# Patient Record
Sex: Female | Born: 1995
Health system: Southern US, Community
[De-identification: ages and names within clinical notes are randomized; demographics above are authoritative.]

## PROBLEM LIST (undated history)

## (undated) DIAGNOSIS — F419 Anxiety disorder, unspecified: Secondary | ICD-10-CM

## (undated) DIAGNOSIS — G43719 Chronic migraine without aura, intractable, without status migrainosus: Secondary | ICD-10-CM

## (undated) DIAGNOSIS — B9620 Unspecified Escherichia coli [E. coli] as the cause of diseases classified elsewhere: Secondary | ICD-10-CM

## (undated) DIAGNOSIS — N12 Tubulo-interstitial nephritis, not specified as acute or chronic: Secondary | ICD-10-CM

## (undated) DIAGNOSIS — F909 Attention-deficit hyperactivity disorder, unspecified type: Secondary | ICD-10-CM

## (undated) DIAGNOSIS — F32A Depression, unspecified: Secondary | ICD-10-CM

## (undated) DIAGNOSIS — F329 Major depressive disorder, single episode, unspecified: Secondary | ICD-10-CM

## (undated) DIAGNOSIS — F99 Mental disorder, not otherwise specified: Secondary | ICD-10-CM

## (undated) DIAGNOSIS — R7881 Bacteremia: Secondary | ICD-10-CM

## (undated) DIAGNOSIS — Z87891 Personal history of nicotine dependence: Secondary | ICD-10-CM

## (undated) HISTORY — DX: Depression, unspecified: F32.A

## (undated) HISTORY — PX: OTHER SURGICAL HISTORY: SHX169

## (undated) HISTORY — DX: Major depressive disorder, single episode, unspecified: F32.9

## (undated) HISTORY — DX: Unspecified Escherichia coli (E. coli) as the cause of diseases classified elsewhere: B96.20

## (undated) HISTORY — DX: Bacteremia: R78.81

## (undated) HISTORY — DX: Mental disorder, not otherwise specified: F99

## (undated) HISTORY — DX: Attention-deficit hyperactivity disorder, unspecified type: F90.9

## (undated) HISTORY — DX: Chronic migraine without aura, intractable, without status migrainosus: G43.719

## (undated) HISTORY — DX: Anxiety disorder, unspecified: F41.9

## (undated) HISTORY — DX: Personal history of nicotine dependence: Z87.891

---
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
## 2015-05-02 DIAGNOSIS — F902 Attention-deficit hyperactivity disorder, combined type: Secondary | ICD-10-CM | POA: Diagnosis not present

## 2015-05-02 DIAGNOSIS — N943 Premenstrual tension syndrome: Secondary | ICD-10-CM | POA: Diagnosis not present

## 2015-05-02 DIAGNOSIS — F329 Major depressive disorder, single episode, unspecified: Secondary | ICD-10-CM | POA: Diagnosis not present

## 2015-06-03 DIAGNOSIS — F900 Attention-deficit hyperactivity disorder, predominantly inattentive type: Secondary | ICD-10-CM | POA: Diagnosis not present

## 2015-06-03 DIAGNOSIS — F332 Major depressive disorder, recurrent severe without psychotic features: Secondary | ICD-10-CM | POA: Diagnosis not present

## 2015-06-03 DIAGNOSIS — F331 Major depressive disorder, recurrent, moderate: Secondary | ICD-10-CM | POA: Diagnosis not present

## 2015-06-12 DIAGNOSIS — S60222A Contusion of left hand, initial encounter: Secondary | ICD-10-CM | POA: Diagnosis not present

## 2015-07-08 DIAGNOSIS — F900 Attention-deficit hyperactivity disorder, predominantly inattentive type: Secondary | ICD-10-CM | POA: Diagnosis not present

## 2015-07-08 DIAGNOSIS — F332 Major depressive disorder, recurrent severe without psychotic features: Secondary | ICD-10-CM | POA: Diagnosis not present

## 2015-08-08 DIAGNOSIS — B355 Tinea imbricata: Secondary | ICD-10-CM | POA: Diagnosis not present

## 2015-08-12 DIAGNOSIS — F332 Major depressive disorder, recurrent severe without psychotic features: Secondary | ICD-10-CM | POA: Diagnosis not present

## 2015-08-12 DIAGNOSIS — F900 Attention-deficit hyperactivity disorder, predominantly inattentive type: Secondary | ICD-10-CM | POA: Diagnosis not present

## 2015-09-11 DIAGNOSIS — F332 Major depressive disorder, recurrent severe without psychotic features: Secondary | ICD-10-CM | POA: Diagnosis not present

## 2015-09-11 DIAGNOSIS — F900 Attention-deficit hyperactivity disorder, predominantly inattentive type: Secondary | ICD-10-CM | POA: Diagnosis not present

## 2015-12-05 DIAGNOSIS — F332 Major depressive disorder, recurrent severe without psychotic features: Secondary | ICD-10-CM | POA: Diagnosis not present

## 2015-12-05 DIAGNOSIS — F900 Attention-deficit hyperactivity disorder, predominantly inattentive type: Secondary | ICD-10-CM | POA: Diagnosis not present

## 2015-12-13 DIAGNOSIS — J0100 Acute maxillary sinusitis, unspecified: Secondary | ICD-10-CM | POA: Diagnosis not present

## 2016-01-01 DIAGNOSIS — F900 Attention-deficit hyperactivity disorder, predominantly inattentive type: Secondary | ICD-10-CM | POA: Diagnosis not present

## 2016-01-01 DIAGNOSIS — F332 Major depressive disorder, recurrent severe without psychotic features: Secondary | ICD-10-CM | POA: Diagnosis not present

## 2016-01-28 DIAGNOSIS — F332 Major depressive disorder, recurrent severe without psychotic features: Secondary | ICD-10-CM | POA: Diagnosis not present

## 2016-01-28 DIAGNOSIS — F900 Attention-deficit hyperactivity disorder, predominantly inattentive type: Secondary | ICD-10-CM | POA: Diagnosis not present

## 2016-03-06 DIAGNOSIS — J039 Acute tonsillitis, unspecified: Secondary | ICD-10-CM | POA: Diagnosis not present

## 2016-03-13 DIAGNOSIS — F900 Attention-deficit hyperactivity disorder, predominantly inattentive type: Secondary | ICD-10-CM | POA: Diagnosis not present

## 2016-03-13 DIAGNOSIS — Z79899 Other long term (current) drug therapy: Secondary | ICD-10-CM | POA: Diagnosis not present

## 2016-03-13 DIAGNOSIS — F332 Major depressive disorder, recurrent severe without psychotic features: Secondary | ICD-10-CM | POA: Diagnosis not present

## 2016-04-09 DIAGNOSIS — L255 Unspecified contact dermatitis due to plants, except food: Secondary | ICD-10-CM | POA: Diagnosis not present

## 2016-04-27 DIAGNOSIS — M79672 Pain in left foot: Secondary | ICD-10-CM | POA: Diagnosis not present

## 2016-05-26 DIAGNOSIS — S93402A Sprain of unspecified ligament of left ankle, initial encounter: Secondary | ICD-10-CM | POA: Diagnosis not present

## 2016-06-05 DIAGNOSIS — F332 Major depressive disorder, recurrent severe without psychotic features: Secondary | ICD-10-CM | POA: Diagnosis not present

## 2016-06-05 DIAGNOSIS — F900 Attention-deficit hyperactivity disorder, predominantly inattentive type: Secondary | ICD-10-CM | POA: Diagnosis not present

## 2016-09-03 DIAGNOSIS — R51 Headache: Secondary | ICD-10-CM | POA: Diagnosis not present

## 2016-09-03 DIAGNOSIS — R635 Abnormal weight gain: Secondary | ICD-10-CM | POA: Diagnosis not present

## 2016-09-03 DIAGNOSIS — M722 Plantar fascial fibromatosis: Secondary | ICD-10-CM | POA: Diagnosis not present

## 2016-09-03 DIAGNOSIS — Z79899 Other long term (current) drug therapy: Secondary | ICD-10-CM | POA: Diagnosis not present

## 2016-09-30 DIAGNOSIS — M722 Plantar fascial fibromatosis: Secondary | ICD-10-CM | POA: Diagnosis not present

## 2016-09-30 DIAGNOSIS — R000 Tachycardia, unspecified: Secondary | ICD-10-CM | POA: Diagnosis not present

## 2016-09-30 DIAGNOSIS — R220 Localized swelling, mass and lump, head: Secondary | ICD-10-CM | POA: Diagnosis not present

## 2016-10-06 ENCOUNTER — Encounter: Payer: Self-pay | Admitting: Podiatry

## 2016-10-06 ENCOUNTER — Ambulatory Visit (INDEPENDENT_AMBULATORY_CARE_PROVIDER_SITE_OTHER): Payer: BLUE CROSS/BLUE SHIELD | Admitting: Podiatry

## 2016-10-06 ENCOUNTER — Ambulatory Visit (INDEPENDENT_AMBULATORY_CARE_PROVIDER_SITE_OTHER): Payer: Self-pay

## 2016-10-06 VITALS — BP 112/77 | HR 82 | Ht 64.0 in | Wt 202.0 lb

## 2016-10-06 DIAGNOSIS — M722 Plantar fascial fibromatosis: Secondary | ICD-10-CM

## 2016-10-06 MED ORDER — MELOXICAM 15 MG PO TABS: 15.0000 mg | ORAL_TABLET | Freq: Every day | ORAL | 0 refills | Status: DC

## 2016-10-06 NOTE — Patient Instructions (Signed)

## 2016-10-09 DIAGNOSIS — F332 Major depressive disorder, recurrent severe without psychotic features: Secondary | ICD-10-CM | POA: Diagnosis not present

## 2016-10-09 DIAGNOSIS — F900 Attention-deficit hyperactivity disorder, predominantly inattentive type: Secondary | ICD-10-CM | POA: Diagnosis not present

## 2016-10-14 DIAGNOSIS — M722 Plantar fascial fibromatosis: Secondary | ICD-10-CM | POA: Diagnosis not present

## 2016-10-14 MED ORDER — DEXAMETHASONE SODIUM PHOSPHATE 120 MG/30ML IJ SOLN
4.0000 mg | Freq: Once | INTRAMUSCULAR | Status: AC
  Administered 2016-10-14: 4 mg via INTRAMUSCULAR

## 2016-10-14 MED ORDER — TRIAMCINOLONE ACETONIDE 10 MG/ML IJ SUSP
5.0000 mg | Freq: Once | INTRAMUSCULAR | Status: AC
  Administered 2016-10-14: 5 mg

## 2016-10-14 NOTE — Progress Notes (Signed)
  Subjective:  Patient ID: Cathy Roman, female    DOB: 25-Oct-1995,  MRN: [id number] HPI Chief Complaint  Patient presents with  . Foot Pain    Bilateral heel pain with sharp shooting pains that go up into ankle and leg     21 y.o. female presents with the above complaint. Reports 8 month history of bilat heel pain, L>R. Reports shooting pain from her heel up into her legs. Pain worst in AM and after periods of inactivity. Denies other pedal issues.  No past medical history on file. No past surgical history on file.  Current Outpatient Prescriptions:  .  buPROPion (WELLBUTRIN XL) 150 MG 24 hr tablet, Take 1 tablet (150 mg total) by mouth daily., Disp: 30 tablet, Rfl: 0 .  FLUoxetine (PROZAC) 10 MG capsule, , Disp: , Rfl:  .  FLUoxetine (PROZAC) 20 MG capsule, , Disp: , Rfl:  .  methylphenidate (CONCERTA) 54 MG PO CR tablet, , Disp: , Rfl:  .  methylphenidate (RITALIN) 20 MG tablet, , Disp: , Rfl:  .  Norgestimate-Ethinyl Estradiol Triphasic (TRINESSA, 28,) 0.18/0.215/0.25 MG-35 MCG tablet, Take 1 tablet by mouth daily., Disp: , Rfl:  .  escitalopram (LEXAPRO) 20 MG tablet, Take 1 tablet (20 mg total) by mouth 2 (two) times daily. (Patient not taking: Reported on 10/06/2016), Disp: 60 tablet, Rfl: 0 .  meloxicam (MOBIC) 15 MG tablet, Take 1 tablet (15 mg total) by mouth daily., Disp: 30 tablet, Rfl: 0 .  Norethindrone-Ethinyl Estradiol-Fe Biphas (LO LOESTRIN FE) 1 MG-10 MCG / 10 MCG tablet, Take 1 tablet by mouth daily. (Patient not taking: Reported on 10/06/2016), Disp: 1 Package, Rfl: 11 .  traZODone (DESYREL) 50 MG tablet, Take 0.5 tablets (25 mg total) by mouth at bedtime as needed for sleep (insomnia). (Patient not taking: Reported on 10/06/2016), Disp: 30 tablet, Rfl: 0  Allergies  Allergen Reactions  . Sulfa Antibiotics Hives   Review of Systems Negative except as noted in the HPI.  Objective:   Vitals:   10/06/16 1403  BP: 112/77  Pulse: 82   General AA&O x3. Normal mood  and affect.  Vascular Dorsalis pedis and posterior tibial pulses  present 2+ bilaterally  Capillary refill normal to all digits. Pedal hair growth normal.  Neurologic Epicritic sensation grossly present bilat.  Dermatologic No open lesions. Interspaces clear of maceration. Nails well groomed and normal in appearance.  Orthopedic: MMT 5/5 in dorsiflexion, plantarflexion, inversion, and eversion left. Tender to palpation at the calcaneal tuber left. No pain with calcaneal squeeze left. Ankle ROM diminished range of motion left. Silfverskiold Test: positive left.   Radiographs: Taken and reviewed. No acute fractures. No evidence of calcaneal stress fracture.  Assessment & Plan:  Patient was evaluated and treated and all questions answered.  Plantar Fasciitis, left - XR reviewed as above.  - Educated on icing and stretching. Instructions given.  - Injection delivered as below. - eRx Meloxicam for pain and inflammation. - Heel pads dispensed.  Procedure: Injection Tendon/Ligament Location: Left plantar fascia at the glabrous junction; medial approach. Skin Prep: Alcohol. Injectate: 1 cc 0.5% marcaine plain, 1 cc dexamethasone phosphate, 0.5 cc kenalog 10. Disposition: Patient tolerated procedure well. Injection site dressed with a band-aid.  Return in about 6 weeks (around 11/17/2016).

## 2016-10-28 DIAGNOSIS — N921 Excessive and frequent menstruation with irregular cycle: Secondary | ICD-10-CM | POA: Diagnosis not present

## 2016-10-28 DIAGNOSIS — D649 Anemia, unspecified: Secondary | ICD-10-CM | POA: Diagnosis not present

## 2016-10-28 DIAGNOSIS — R51 Headache: Secondary | ICD-10-CM | POA: Diagnosis not present

## 2016-11-05 DIAGNOSIS — R51 Headache: Secondary | ICD-10-CM | POA: Diagnosis not present

## 2016-11-16 DIAGNOSIS — R51 Headache: Secondary | ICD-10-CM | POA: Diagnosis not present

## 2016-11-16 DIAGNOSIS — R000 Tachycardia, unspecified: Secondary | ICD-10-CM | POA: Diagnosis not present

## 2016-11-17 ENCOUNTER — Ambulatory Visit (INDEPENDENT_AMBULATORY_CARE_PROVIDER_SITE_OTHER): Payer: BLUE CROSS/BLUE SHIELD | Admitting: Podiatry

## 2016-11-17 DIAGNOSIS — M722 Plantar fascial fibromatosis: Secondary | ICD-10-CM | POA: Diagnosis not present

## 2016-11-17 MED ORDER — METHYLPREDNISOLONE 4 MG PO TBPK: ORAL_TABLET | ORAL | 0 refills | Status: DC

## 2016-11-17 NOTE — Patient Instructions (Signed)

## 2016-11-17 NOTE — Progress Notes (Addendum)
  Subjective:  Patient ID: Cathy Roman, female    DOB: 05/09/1995,  MRN: [id number]  Chief Complaint  Patient presents with  . Plantar Fasciitis    injections given at last visit only helped x1 wk. Bil feet are hurting again, pain is almost unbearable   21 y.o. female returns for the above complaint. States the injections helped for about a week. Yesterday were almost unbearable. But is slightly better today  Objective:  There were no vitals filed for this visit. General AA&O x3. Normal mood and affect.  Vascular Pedal pulses palpable.  Neurologic Epicritic sensation grossly intact.  Dermatologic No open lesions. Skin normal texture and turgor.  Orthopedic: Pain on palpation to the calcaneal tuber's bilaterally    Assessment & Plan:  Patient was evaluated and treated and all questions answered.  Plantar Fasciitis -Injection Declined -E Rx for Medrol pack -Discussed over-the-counter versus custom orthotics. -Pre-fab orthotics dispensed. -Will schedule patient for appointment for orthotics for plantar fasciitis.  Return in about 3 weeks (around 12/08/2016) for Plantar fasciitis.

## 2016-11-25 DIAGNOSIS — R51 Headache: Secondary | ICD-10-CM | POA: Diagnosis not present

## 2016-12-08 ENCOUNTER — Ambulatory Visit: Payer: BLUE CROSS/BLUE SHIELD | Admitting: Podiatry

## 2017-01-28 DIAGNOSIS — R51 Headache: Secondary | ICD-10-CM | POA: Diagnosis not present

## 2017-02-03 DIAGNOSIS — Z049 Encounter for examination and observation for unspecified reason: Secondary | ICD-10-CM | POA: Diagnosis not present

## 2017-02-03 DIAGNOSIS — G43719 Chronic migraine without aura, intractable, without status migrainosus: Secondary | ICD-10-CM | POA: Diagnosis not present

## 2017-02-17 DIAGNOSIS — M542 Cervicalgia: Secondary | ICD-10-CM | POA: Diagnosis not present

## 2017-02-17 DIAGNOSIS — M791 Myalgia, unspecified site: Secondary | ICD-10-CM | POA: Diagnosis not present

## 2017-02-17 DIAGNOSIS — G518 Other disorders of facial nerve: Secondary | ICD-10-CM | POA: Diagnosis not present

## 2017-02-17 DIAGNOSIS — G43719 Chronic migraine without aura, intractable, without status migrainosus: Secondary | ICD-10-CM | POA: Diagnosis not present

## 2017-03-09 NOTE — Progress Notes (Signed)
Cardiology Office Note   Date:  03/17/2017   ID:  Cathy Roman, DOB [birth year], MRN [id number]  PCP:  Philemon Kingdom, MD  Cardiologist:   Charlton Haws, MD   No chief complaint on file.     History of Present Illness: Cathy Roman is a 22 y.o. female who presents for consultation regarding tachycardia Referred by Dr Stana Bunting Significant issues with headaches. Triggers include poor sleep, weather and emotional stress Currently on host neurally active drugs including baclofen, concerta, prozac welbutrin and ritalin  She is sedentary and does not exercise. Works At [employer]. She has a 22 yo husky named Industrial/product designer. Mom works for home health in [place] Dad Has MVP. She has not had chest pain, syncope rate palpitations and functional dyspnea with Exertion Denies ETOH, drugs, excess caffeine and TSH was normal in October     Past Medical History:  Diagnosis Date  . ADHD    ADD  . Anxiety disorder   . Chronic migraine without aura, intractable, without status migrainosus   . Depression   . Former cigarette smoker   . Psychiatric disorder     Past Surgical History:  Procedure Laterality Date  . NONE       Current Outpatient Medications  Medication Sig Dispense Refill  . buPROPion (WELLBUTRIN XL) 150 MG 24 hr tablet Take 1 tablet (150 mg total) by mouth daily. 30 tablet 0  . FLUoxetine (PROZAC) 10 MG capsule     . FLUoxetine (PROZAC) 20 MG capsule     . methylphenidate (CONCERTA) 54 MG PO CR tablet     . methylphenidate (RITALIN) 20 MG tablet     . METOPROLOL TARTRATE PO Take 50 mg by mouth 2 (two) times daily.    . Norgestimate-Ethinyl Estradiol Triphasic (TRINESSA, 28,) 0.18/0.215/0.25 MG-35 MCG tablet Take 1 tablet by mouth daily.    . traZODone (DESYREL) 50 MG tablet Take 0.5 tablets (25 mg total) by mouth at bedtime as needed for sleep (insomnia). 30 tablet 0   No current facility-administered medications for this visit.     Allergies:   Sulfa antibiotics     Social History:  The patient  reports that she has quit smoking. she has never used smokeless tobacco. She reports that she does not drink alcohol or use drugs.   Family History:  The patient's family history is not on file.    ROS:  Please see the history of present illness.   Otherwise, review of systems are positive for none.   All other systems are reviewed and negative.    PHYSICAL EXAM: VS:  BP 130/78   Pulse 88   Ht 5\' 4"  (1.626 m)   Wt 213 lb 8 oz (96.8 kg)   SpO2 99%   BMI 36.65 kg/m  , BMI Body mass index is 36.65 kg/m. Affect appropriate Overweight white female  HEENT: normal Neck supple with no adenopathy JVP normal no bruits no thyromegaly Lungs clear with no wheezing and good diaphragmatic motion Heart:  S1/S2 no murmur, no rub, gallop or click PMI normal Abdomen: benighn, BS positve, no tenderness, no AAA no bruit.  No HSM or HJR Distal pulses intact with no bruits No edema Neuro non-focal Skin warm and dry No muscular weakness    EKG:  2016 NSR rate 70 normal ECG no pre excitation 02/10/17 SR rate 75 normal    Recent Labs: No results found for requested labs within last 8760 hours.    Lipid Panel No results  found for: CHOL, TRIG, HDL, CHOLHDL, VLDL, LDLCALC, LDLDIRECT    Wt Readings from Last 3 Encounters:  03/17/17 213 lb 8 oz (96.8 kg)  10/06/16 202 lb (91.6 kg)      Other studies Reviewed: Additional studies/ records that were reviewed today include: Office notes Dr Filbert SchilderPachnow.    ASSESSMENT AND PLAN:  1.  Tachycardia likely related to polypharmacy and ritalin. Continue beta blocker labs ok ECG and exam normal will order echo to r/o structural heart disease 2. ADD:  Continue Concerta as non stimulant limit use of ritalin  3. Depression: combination of Wellbutrin and Prozac in setting of additional meds for ADD Not ideal f/u primary to simplify psycho-active drugs    Current medicines are reviewed at length with the patient today.   The patient does not have concerns regarding medicines.  The following changes have been made:  no change  Labs/ tests ordered today include: Echo/TTE  No orders of the defined types were placed in this encounter.    Disposition:   FU with cardiology PRN      Signed, Charlton HawsPeter Aryaman Haliburton, MD  03/17/2017 8:48 AM    [facility] [address], [place], [place]  [address] Phone: [phone]; Fax: [phone]

## 2017-03-10 ENCOUNTER — Encounter: Payer: Self-pay | Admitting: Cardiovascular Disease

## 2017-03-16 DIAGNOSIS — Z79899 Other long term (current) drug therapy: Secondary | ICD-10-CM | POA: Diagnosis not present

## 2017-03-16 DIAGNOSIS — F900 Attention-deficit hyperactivity disorder, predominantly inattentive type: Secondary | ICD-10-CM | POA: Diagnosis not present

## 2017-03-16 DIAGNOSIS — F332 Major depressive disorder, recurrent severe without psychotic features: Secondary | ICD-10-CM | POA: Diagnosis not present

## 2017-03-17 ENCOUNTER — Encounter (INDEPENDENT_AMBULATORY_CARE_PROVIDER_SITE_OTHER): Payer: Self-pay

## 2017-03-17 ENCOUNTER — Encounter: Payer: Self-pay | Admitting: Cardiovascular Disease

## 2017-03-17 ENCOUNTER — Ambulatory Visit (INDEPENDENT_AMBULATORY_CARE_PROVIDER_SITE_OTHER): Payer: BLUE CROSS/BLUE SHIELD | Admitting: Cardiovascular Disease

## 2017-03-17 VITALS — BP 130/78 | HR 88 | Ht 64.0 in | Wt 213.5 lb

## 2017-03-17 DIAGNOSIS — R000 Tachycardia, unspecified: Secondary | ICD-10-CM

## 2017-03-17 NOTE — Addendum Note (Signed)
Addended by: Weber CooksOOPER, Jari Dipasquale on: 03/17/2017 04:52 PM   Modules accepted: Orders

## 2017-03-17 NOTE — Patient Instructions (Addendum)

## 2017-03-31 ENCOUNTER — Ambulatory Visit (HOSPITAL_COMMUNITY): Payer: BLUE CROSS/BLUE SHIELD | Attending: Cardiovascular Disease

## 2017-03-31 DIAGNOSIS — R0989 Other specified symptoms and signs involving the circulatory and respiratory systems: Secondary | ICD-10-CM

## 2017-04-13 DIAGNOSIS — F900 Attention-deficit hyperactivity disorder, predominantly inattentive type: Secondary | ICD-10-CM | POA: Diagnosis not present

## 2017-04-13 DIAGNOSIS — F332 Major depressive disorder, recurrent severe without psychotic features: Secondary | ICD-10-CM | POA: Diagnosis not present

## 2017-04-15 ENCOUNTER — Ambulatory Visit (HOSPITAL_COMMUNITY): Payer: BLUE CROSS/BLUE SHIELD | Attending: Cardiology

## 2017-04-15 ENCOUNTER — Other Ambulatory Visit: Payer: Self-pay

## 2017-04-15 DIAGNOSIS — Z87891 Personal history of nicotine dependence: Secondary | ICD-10-CM | POA: Diagnosis not present

## 2017-04-15 DIAGNOSIS — R000 Tachycardia, unspecified: Secondary | ICD-10-CM | POA: Insufficient documentation

## 2017-04-15 DIAGNOSIS — Z8249 Family history of ischemic heart disease and other diseases of the circulatory system: Secondary | ICD-10-CM | POA: Insufficient documentation

## 2017-04-15 DIAGNOSIS — G43909 Migraine, unspecified, not intractable, without status migrainosus: Secondary | ICD-10-CM | POA: Insufficient documentation

## 2017-06-08 DIAGNOSIS — F900 Attention-deficit hyperactivity disorder, predominantly inattentive type: Secondary | ICD-10-CM | POA: Diagnosis not present

## 2017-06-08 DIAGNOSIS — F332 Major depressive disorder, recurrent severe without psychotic features: Secondary | ICD-10-CM | POA: Diagnosis not present

## 2017-08-02 DIAGNOSIS — F900 Attention-deficit hyperactivity disorder, predominantly inattentive type: Secondary | ICD-10-CM | POA: Diagnosis not present

## 2017-08-02 DIAGNOSIS — F332 Major depressive disorder, recurrent severe without psychotic features: Secondary | ICD-10-CM | POA: Diagnosis not present

## 2017-09-15 DIAGNOSIS — R000 Tachycardia, unspecified: Secondary | ICD-10-CM | POA: Diagnosis not present

## 2017-09-15 DIAGNOSIS — R51 Headache: Secondary | ICD-10-CM | POA: Diagnosis not present

## 2017-09-15 DIAGNOSIS — E6609 Other obesity due to excess calories: Secondary | ICD-10-CM | POA: Diagnosis not present

## 2017-09-15 DIAGNOSIS — Z79899 Other long term (current) drug therapy: Secondary | ICD-10-CM | POA: Diagnosis not present

## 2017-09-23 DIAGNOSIS — R0789 Other chest pain: Secondary | ICD-10-CM | POA: Diagnosis not present

## 2017-09-23 DIAGNOSIS — R0782 Intercostal pain: Secondary | ICD-10-CM | POA: Diagnosis not present

## 2017-09-28 DIAGNOSIS — F900 Attention-deficit hyperactivity disorder, predominantly inattentive type: Secondary | ICD-10-CM | POA: Diagnosis not present

## 2017-09-28 DIAGNOSIS — F332 Major depressive disorder, recurrent severe without psychotic features: Secondary | ICD-10-CM | POA: Diagnosis not present

## 2017-10-12 DIAGNOSIS — J0100 Acute maxillary sinusitis, unspecified: Secondary | ICD-10-CM | POA: Diagnosis not present

## 2017-11-23 DIAGNOSIS — F332 Major depressive disorder, recurrent severe without psychotic features: Secondary | ICD-10-CM | POA: Diagnosis not present

## 2017-11-23 DIAGNOSIS — F900 Attention-deficit hyperactivity disorder, predominantly inattentive type: Secondary | ICD-10-CM | POA: Diagnosis not present

## 2018-05-18 DIAGNOSIS — F900 Attention-deficit hyperactivity disorder, predominantly inattentive type: Secondary | ICD-10-CM | POA: Diagnosis not present

## 2018-05-18 DIAGNOSIS — F331 Major depressive disorder, recurrent, moderate: Secondary | ICD-10-CM | POA: Diagnosis not present

## 2018-05-19 DIAGNOSIS — Z6841 Body Mass Index (BMI) 40.0 and over, adult: Secondary | ICD-10-CM | POA: Diagnosis not present

## 2018-05-19 DIAGNOSIS — R51 Headache: Secondary | ICD-10-CM | POA: Diagnosis not present

## 2018-08-02 DIAGNOSIS — F331 Major depressive disorder, recurrent, moderate: Secondary | ICD-10-CM | POA: Diagnosis not present

## 2018-08-02 DIAGNOSIS — F900 Attention-deficit hyperactivity disorder, predominantly inattentive type: Secondary | ICD-10-CM | POA: Diagnosis not present

## 2018-10-25 DIAGNOSIS — F331 Major depressive disorder, recurrent, moderate: Secondary | ICD-10-CM | POA: Diagnosis not present

## 2018-10-25 DIAGNOSIS — F900 Attention-deficit hyperactivity disorder, predominantly inattentive type: Secondary | ICD-10-CM | POA: Diagnosis not present

## 2018-12-11 DIAGNOSIS — S59902A Unspecified injury of left elbow, initial encounter: Secondary | ICD-10-CM | POA: Diagnosis not present

## 2018-12-11 DIAGNOSIS — S5002XA Contusion of left elbow, initial encounter: Secondary | ICD-10-CM | POA: Diagnosis not present

## 2018-12-19 DIAGNOSIS — S5002XA Contusion of left elbow, initial encounter: Secondary | ICD-10-CM | POA: Diagnosis not present

## 2018-12-26 DIAGNOSIS — R300 Dysuria: Secondary | ICD-10-CM | POA: Diagnosis not present

## 2018-12-26 DIAGNOSIS — Z20828 Contact with and (suspected) exposure to other viral communicable diseases: Secondary | ICD-10-CM | POA: Diagnosis not present

## 2018-12-26 DIAGNOSIS — N3091 Cystitis, unspecified with hematuria: Secondary | ICD-10-CM | POA: Diagnosis not present

## 2018-12-28 DIAGNOSIS — Z20828 Contact with and (suspected) exposure to other viral communicable diseases: Secondary | ICD-10-CM | POA: Diagnosis not present

## 2018-12-28 DIAGNOSIS — R05 Cough: Secondary | ICD-10-CM | POA: Diagnosis not present

## 2018-12-28 DIAGNOSIS — R509 Fever, unspecified: Secondary | ICD-10-CM | POA: Diagnosis not present

## 2018-12-28 DIAGNOSIS — R6883 Chills (without fever): Secondary | ICD-10-CM | POA: Diagnosis not present

## 2019-01-02 DIAGNOSIS — S5002XA Contusion of left elbow, initial encounter: Secondary | ICD-10-CM | POA: Diagnosis not present

## 2019-01-05 DIAGNOSIS — S5002XA Contusion of left elbow, initial encounter: Secondary | ICD-10-CM | POA: Diagnosis not present

## 2019-01-05 DIAGNOSIS — M25522 Pain in left elbow: Secondary | ICD-10-CM | POA: Diagnosis not present

## 2019-01-05 DIAGNOSIS — S59902A Unspecified injury of left elbow, initial encounter: Secondary | ICD-10-CM | POA: Diagnosis not present

## 2019-01-09 DIAGNOSIS — S5002XA Contusion of left elbow, initial encounter: Secondary | ICD-10-CM | POA: Diagnosis not present

## 2019-01-17 DIAGNOSIS — F331 Major depressive disorder, recurrent, moderate: Secondary | ICD-10-CM | POA: Diagnosis not present

## 2019-01-17 DIAGNOSIS — F900 Attention-deficit hyperactivity disorder, predominantly inattentive type: Secondary | ICD-10-CM | POA: Diagnosis not present

## 2019-01-22 DIAGNOSIS — J029 Acute pharyngitis, unspecified: Secondary | ICD-10-CM | POA: Diagnosis not present

## 2019-02-17 DIAGNOSIS — S93401A Sprain of unspecified ligament of right ankle, initial encounter: Secondary | ICD-10-CM | POA: Diagnosis not present

## 2019-03-01 DIAGNOSIS — S93401A Sprain of unspecified ligament of right ankle, initial encounter: Secondary | ICD-10-CM | POA: Diagnosis not present

## 2019-03-14 DIAGNOSIS — F331 Major depressive disorder, recurrent, moderate: Secondary | ICD-10-CM | POA: Diagnosis not present

## 2019-03-14 DIAGNOSIS — Z79899 Other long term (current) drug therapy: Secondary | ICD-10-CM | POA: Diagnosis not present

## 2019-03-14 DIAGNOSIS — F900 Attention-deficit hyperactivity disorder, predominantly inattentive type: Secondary | ICD-10-CM | POA: Diagnosis not present

## 2019-03-14 DIAGNOSIS — F909 Attention-deficit hyperactivity disorder, unspecified type: Secondary | ICD-10-CM | POA: Diagnosis not present

## 2019-04-11 DIAGNOSIS — Z79899 Other long term (current) drug therapy: Secondary | ICD-10-CM | POA: Diagnosis not present

## 2019-04-11 DIAGNOSIS — F909 Attention-deficit hyperactivity disorder, unspecified type: Secondary | ICD-10-CM | POA: Diagnosis not present

## 2019-04-11 DIAGNOSIS — F332 Major depressive disorder, recurrent severe without psychotic features: Secondary | ICD-10-CM | POA: Diagnosis not present

## 2019-04-11 DIAGNOSIS — F900 Attention-deficit hyperactivity disorder, predominantly inattentive type: Secondary | ICD-10-CM | POA: Diagnosis not present

## 2019-04-11 DIAGNOSIS — F331 Major depressive disorder, recurrent, moderate: Secondary | ICD-10-CM | POA: Diagnosis not present

## 2019-04-12 DIAGNOSIS — S93401A Sprain of unspecified ligament of right ankle, initial encounter: Secondary | ICD-10-CM | POA: Diagnosis not present

## 2019-05-16 DIAGNOSIS — F411 Generalized anxiety disorder: Secondary | ICD-10-CM | POA: Diagnosis not present

## 2019-05-16 DIAGNOSIS — F339 Major depressive disorder, recurrent, unspecified: Secondary | ICD-10-CM | POA: Diagnosis not present

## 2019-05-16 DIAGNOSIS — F4310 Post-traumatic stress disorder, unspecified: Secondary | ICD-10-CM | POA: Diagnosis not present

## 2019-05-29 DIAGNOSIS — F411 Generalized anxiety disorder: Secondary | ICD-10-CM | POA: Diagnosis not present

## 2019-05-29 DIAGNOSIS — F4310 Post-traumatic stress disorder, unspecified: Secondary | ICD-10-CM | POA: Diagnosis not present

## 2019-05-29 DIAGNOSIS — F339 Major depressive disorder, recurrent, unspecified: Secondary | ICD-10-CM | POA: Diagnosis not present

## 2019-06-12 DIAGNOSIS — F4310 Post-traumatic stress disorder, unspecified: Secondary | ICD-10-CM | POA: Diagnosis not present

## 2019-06-12 DIAGNOSIS — F339 Major depressive disorder, recurrent, unspecified: Secondary | ICD-10-CM | POA: Diagnosis not present

## 2019-06-12 DIAGNOSIS — F411 Generalized anxiety disorder: Secondary | ICD-10-CM | POA: Diagnosis not present

## 2019-06-26 DIAGNOSIS — F339 Major depressive disorder, recurrent, unspecified: Secondary | ICD-10-CM | POA: Diagnosis not present

## 2019-06-26 DIAGNOSIS — F411 Generalized anxiety disorder: Secondary | ICD-10-CM | POA: Diagnosis not present

## 2019-06-26 DIAGNOSIS — F4310 Post-traumatic stress disorder, unspecified: Secondary | ICD-10-CM | POA: Diagnosis not present

## 2019-07-04 DIAGNOSIS — F900 Attention-deficit hyperactivity disorder, predominantly inattentive type: Secondary | ICD-10-CM | POA: Diagnosis not present

## 2019-07-04 DIAGNOSIS — Z79899 Other long term (current) drug therapy: Secondary | ICD-10-CM | POA: Diagnosis not present

## 2019-07-04 DIAGNOSIS — F331 Major depressive disorder, recurrent, moderate: Secondary | ICD-10-CM | POA: Diagnosis not present

## 2019-07-04 DIAGNOSIS — F909 Attention-deficit hyperactivity disorder, unspecified type: Secondary | ICD-10-CM | POA: Diagnosis not present

## 2019-07-07 DIAGNOSIS — J069 Acute upper respiratory infection, unspecified: Secondary | ICD-10-CM | POA: Diagnosis not present

## 2019-07-07 DIAGNOSIS — Z20828 Contact with and (suspected) exposure to other viral communicable diseases: Secondary | ICD-10-CM | POA: Diagnosis not present

## 2019-09-26 DIAGNOSIS — F909 Attention-deficit hyperactivity disorder, unspecified type: Secondary | ICD-10-CM | POA: Diagnosis not present

## 2019-09-26 DIAGNOSIS — F331 Major depressive disorder, recurrent, moderate: Secondary | ICD-10-CM | POA: Diagnosis not present

## 2019-09-26 DIAGNOSIS — Z79899 Other long term (current) drug therapy: Secondary | ICD-10-CM | POA: Diagnosis not present

## 2019-09-26 DIAGNOSIS — F900 Attention-deficit hyperactivity disorder, predominantly inattentive type: Secondary | ICD-10-CM | POA: Diagnosis not present

## 2019-12-19 DIAGNOSIS — Z79899 Other long term (current) drug therapy: Secondary | ICD-10-CM | POA: Diagnosis not present

## 2019-12-19 DIAGNOSIS — F900 Attention-deficit hyperactivity disorder, predominantly inattentive type: Secondary | ICD-10-CM | POA: Diagnosis not present

## 2019-12-19 DIAGNOSIS — F331 Major depressive disorder, recurrent, moderate: Secondary | ICD-10-CM | POA: Diagnosis not present

## 2019-12-19 DIAGNOSIS — F909 Attention-deficit hyperactivity disorder, unspecified type: Secondary | ICD-10-CM | POA: Diagnosis not present

## 2020-01-09 DIAGNOSIS — Z3689 Encounter for other specified antenatal screening: Secondary | ICD-10-CM | POA: Diagnosis not present

## 2020-01-09 DIAGNOSIS — O30041 Twin pregnancy, dichorionic/diamniotic, first trimester: Secondary | ICD-10-CM | POA: Diagnosis not present

## 2020-01-09 DIAGNOSIS — O99211 Obesity complicating pregnancy, first trimester: Secondary | ICD-10-CM | POA: Diagnosis not present

## 2020-01-09 DIAGNOSIS — Z3A11 11 weeks gestation of pregnancy: Secondary | ICD-10-CM | POA: Diagnosis not present

## 2020-01-09 DIAGNOSIS — E669 Obesity, unspecified: Secondary | ICD-10-CM | POA: Diagnosis not present

## 2020-01-09 DIAGNOSIS — O3680X0 Pregnancy with inconclusive fetal viability, not applicable or unspecified: Secondary | ICD-10-CM | POA: Diagnosis not present

## 2020-01-18 DIAGNOSIS — E669 Obesity, unspecified: Secondary | ICD-10-CM | POA: Diagnosis not present

## 2020-01-18 DIAGNOSIS — O99211 Obesity complicating pregnancy, first trimester: Secondary | ICD-10-CM | POA: Diagnosis not present

## 2020-01-18 DIAGNOSIS — Z3A12 12 weeks gestation of pregnancy: Secondary | ICD-10-CM | POA: Diagnosis not present

## 2020-01-18 DIAGNOSIS — Z3682 Encounter for antenatal screening for nuchal translucency: Secondary | ICD-10-CM | POA: Diagnosis not present

## 2020-01-18 DIAGNOSIS — O30041 Twin pregnancy, dichorionic/diamniotic, first trimester: Secondary | ICD-10-CM | POA: Diagnosis not present

## 2020-01-30 DIAGNOSIS — F332 Major depressive disorder, recurrent severe without psychotic features: Secondary | ICD-10-CM | POA: Diagnosis not present

## 2020-01-30 DIAGNOSIS — F900 Attention-deficit hyperactivity disorder, predominantly inattentive type: Secondary | ICD-10-CM | POA: Diagnosis not present

## 2020-01-30 DIAGNOSIS — F331 Major depressive disorder, recurrent, moderate: Secondary | ICD-10-CM | POA: Diagnosis not present

## 2020-01-30 DIAGNOSIS — F909 Attention-deficit hyperactivity disorder, unspecified type: Secondary | ICD-10-CM | POA: Diagnosis not present

## 2020-02-15 DIAGNOSIS — O30042 Twin pregnancy, dichorionic/diamniotic, second trimester: Secondary | ICD-10-CM | POA: Diagnosis not present

## 2020-02-15 DIAGNOSIS — E669 Obesity, unspecified: Secondary | ICD-10-CM | POA: Diagnosis not present

## 2020-02-15 DIAGNOSIS — O368320 Maternal care for abnormalities of the fetal heart rate or rhythm, second trimester, not applicable or unspecified: Secondary | ICD-10-CM | POA: Diagnosis not present

## 2020-02-15 DIAGNOSIS — Z3A16 16 weeks gestation of pregnancy: Secondary | ICD-10-CM | POA: Diagnosis not present

## 2020-02-15 DIAGNOSIS — O99212 Obesity complicating pregnancy, second trimester: Secondary | ICD-10-CM | POA: Diagnosis not present

## 2020-02-29 DIAGNOSIS — E669 Obesity, unspecified: Secondary | ICD-10-CM | POA: Diagnosis not present

## 2020-02-29 DIAGNOSIS — Z87440 Personal history of urinary (tract) infections: Secondary | ICD-10-CM | POA: Diagnosis not present

## 2020-02-29 DIAGNOSIS — Z363 Encounter for antenatal screening for malformations: Secondary | ICD-10-CM | POA: Diagnosis not present

## 2020-02-29 DIAGNOSIS — Z3A18 18 weeks gestation of pregnancy: Secondary | ICD-10-CM | POA: Diagnosis not present

## 2020-02-29 DIAGNOSIS — O99212 Obesity complicating pregnancy, second trimester: Secondary | ICD-10-CM | POA: Diagnosis not present

## 2020-02-29 DIAGNOSIS — Z3689 Encounter for other specified antenatal screening: Secondary | ICD-10-CM | POA: Diagnosis not present

## 2020-02-29 DIAGNOSIS — O30042 Twin pregnancy, dichorionic/diamniotic, second trimester: Secondary | ICD-10-CM | POA: Diagnosis not present

## 2020-03-26 DIAGNOSIS — F332 Major depressive disorder, recurrent severe without psychotic features: Secondary | ICD-10-CM | POA: Diagnosis not present

## 2020-03-26 DIAGNOSIS — F331 Major depressive disorder, recurrent, moderate: Secondary | ICD-10-CM | POA: Diagnosis not present

## 2020-03-26 DIAGNOSIS — F900 Attention-deficit hyperactivity disorder, predominantly inattentive type: Secondary | ICD-10-CM | POA: Diagnosis not present

## 2020-03-26 DIAGNOSIS — F909 Attention-deficit hyperactivity disorder, unspecified type: Secondary | ICD-10-CM | POA: Diagnosis not present

## 2020-03-27 DIAGNOSIS — Z362 Encounter for other antenatal screening follow-up: Secondary | ICD-10-CM | POA: Diagnosis not present

## 2020-03-27 DIAGNOSIS — E669 Obesity, unspecified: Secondary | ICD-10-CM | POA: Diagnosis not present

## 2020-03-27 DIAGNOSIS — O30042 Twin pregnancy, dichorionic/diamniotic, second trimester: Secondary | ICD-10-CM | POA: Diagnosis not present

## 2020-03-27 DIAGNOSIS — O99212 Obesity complicating pregnancy, second trimester: Secondary | ICD-10-CM | POA: Diagnosis not present

## 2020-04-23 DIAGNOSIS — Z23 Encounter for immunization: Secondary | ICD-10-CM | POA: Diagnosis not present

## 2020-04-23 DIAGNOSIS — O26892 Other specified pregnancy related conditions, second trimester: Secondary | ICD-10-CM | POA: Diagnosis not present

## 2020-04-23 DIAGNOSIS — Z3689 Encounter for other specified antenatal screening: Secondary | ICD-10-CM | POA: Diagnosis not present

## 2020-04-23 DIAGNOSIS — Z362 Encounter for other antenatal screening follow-up: Secondary | ICD-10-CM | POA: Diagnosis not present

## 2020-04-23 DIAGNOSIS — Z3A26 26 weeks gestation of pregnancy: Secondary | ICD-10-CM | POA: Diagnosis not present

## 2020-04-23 DIAGNOSIS — O30042 Twin pregnancy, dichorionic/diamniotic, second trimester: Secondary | ICD-10-CM | POA: Diagnosis not present

## 2020-04-23 DIAGNOSIS — Z87440 Personal history of urinary (tract) infections: Secondary | ICD-10-CM | POA: Diagnosis not present

## 2020-05-03 DIAGNOSIS — B349 Viral infection, unspecified: Secondary | ICD-10-CM | POA: Diagnosis not present

## 2020-05-03 DIAGNOSIS — Z20828 Contact with and (suspected) exposure to other viral communicable diseases: Secondary | ICD-10-CM | POA: Diagnosis not present

## 2020-05-03 DIAGNOSIS — R110 Nausea: Secondary | ICD-10-CM | POA: Diagnosis not present

## 2020-05-07 DIAGNOSIS — Z87440 Personal history of urinary (tract) infections: Secondary | ICD-10-CM | POA: Diagnosis not present

## 2020-05-07 DIAGNOSIS — Z3689 Encounter for other specified antenatal screening: Secondary | ICD-10-CM | POA: Diagnosis not present

## 2020-05-07 DIAGNOSIS — Z3A28 28 weeks gestation of pregnancy: Secondary | ICD-10-CM | POA: Diagnosis not present

## 2020-05-19 ENCOUNTER — Encounter (HOSPITAL_COMMUNITY): Payer: Self-pay | Admitting: Obstetrics and Gynecology

## 2020-05-19 ENCOUNTER — Inpatient Hospital Stay (HOSPITAL_COMMUNITY): Payer: BC Managed Care – PPO

## 2020-05-19 ENCOUNTER — Inpatient Hospital Stay (HOSPITAL_COMMUNITY)
Admission: AD | Admit: 2020-05-19 | Discharge: 2020-05-24 | DRG: 786 | Disposition: A | Discharge status: Home / Self Care | Payer: BC Managed Care – PPO | Attending: Obstetrics and Gynecology | Admitting: Obstetrics and Gynecology

## 2020-05-19 ENCOUNTER — Other Ambulatory Visit: Payer: Self-pay

## 2020-05-19 DIAGNOSIS — O321XX2 Maternal care for breech presentation, fetus 2: Secondary | ICD-10-CM | POA: Diagnosis present

## 2020-05-19 DIAGNOSIS — O2303 Infections of kidney in pregnancy, third trimester: Secondary | ICD-10-CM | POA: Diagnosis not present

## 2020-05-19 DIAGNOSIS — O163 Unspecified maternal hypertension, third trimester: Secondary | ICD-10-CM | POA: Insufficient documentation

## 2020-05-19 DIAGNOSIS — K5900 Constipation, unspecified: Secondary | ICD-10-CM | POA: Diagnosis present

## 2020-05-19 DIAGNOSIS — Z20822 Contact with and (suspected) exposure to covid-19: Secondary | ICD-10-CM | POA: Diagnosis present

## 2020-05-19 DIAGNOSIS — O4693 Antepartum hemorrhage, unspecified, third trimester: Secondary | ICD-10-CM

## 2020-05-19 DIAGNOSIS — O9942 Diseases of the circulatory system complicating childbirth: Secondary | ICD-10-CM | POA: Diagnosis present

## 2020-05-19 DIAGNOSIS — O320XX1 Maternal care for unstable lie, fetus 1: Secondary | ICD-10-CM | POA: Diagnosis not present

## 2020-05-19 DIAGNOSIS — R000 Tachycardia, unspecified: Secondary | ICD-10-CM | POA: Diagnosis present

## 2020-05-19 DIAGNOSIS — B9620 Unspecified Escherichia coli [E. coli] as the cause of diseases classified elsewhere: Secondary | ICD-10-CM | POA: Diagnosis not present

## 2020-05-19 DIAGNOSIS — O9902 Anemia complicating childbirth: Secondary | ICD-10-CM | POA: Diagnosis present

## 2020-05-19 DIAGNOSIS — N12 Tubulo-interstitial nephritis, not specified as acute or chronic: Secondary | ICD-10-CM | POA: Diagnosis present

## 2020-05-19 DIAGNOSIS — O2340 Unspecified infection of urinary tract in pregnancy, unspecified trimester: Secondary | ICD-10-CM | POA: Diagnosis present

## 2020-05-19 DIAGNOSIS — O30049 Twin pregnancy, dichorionic/diamniotic, unspecified trimester: Secondary | ICD-10-CM | POA: Diagnosis present

## 2020-05-19 DIAGNOSIS — Z3A30 30 weeks gestation of pregnancy: Secondary | ICD-10-CM

## 2020-05-19 DIAGNOSIS — O30043 Twin pregnancy, dichorionic/diamniotic, third trimester: Secondary | ICD-10-CM | POA: Diagnosis present

## 2020-05-19 DIAGNOSIS — R0600 Dyspnea, unspecified: Secondary | ICD-10-CM

## 2020-05-19 DIAGNOSIS — O8621 Infection of kidney following delivery: Secondary | ICD-10-CM | POA: Diagnosis not present

## 2020-05-19 DIAGNOSIS — O321XX1 Maternal care for breech presentation, fetus 1: Secondary | ICD-10-CM | POA: Diagnosis not present

## 2020-05-19 DIAGNOSIS — D509 Iron deficiency anemia, unspecified: Secondary | ICD-10-CM | POA: Diagnosis not present

## 2020-05-19 DIAGNOSIS — O99892 Other specified diseases and conditions complicating childbirth: Secondary | ICD-10-CM | POA: Diagnosis present

## 2020-05-19 DIAGNOSIS — O99214 Obesity complicating childbirth: Secondary | ICD-10-CM | POA: Diagnosis not present

## 2020-05-19 DIAGNOSIS — N1330 Unspecified hydronephrosis: Secondary | ICD-10-CM | POA: Diagnosis not present

## 2020-05-19 DIAGNOSIS — O99213 Obesity complicating pregnancy, third trimester: Secondary | ICD-10-CM | POA: Diagnosis not present

## 2020-05-19 DIAGNOSIS — R509 Fever, unspecified: Secondary | ICD-10-CM | POA: Diagnosis present

## 2020-05-19 DIAGNOSIS — Z3A40 40 weeks gestation of pregnancy: Secondary | ICD-10-CM | POA: Diagnosis not present

## 2020-05-19 DIAGNOSIS — O411230 Chorioamnionitis, third trimester, not applicable or unspecified: Secondary | ICD-10-CM | POA: Diagnosis not present

## 2020-05-19 DIAGNOSIS — R7881 Bacteremia: Secondary | ICD-10-CM

## 2020-05-19 DIAGNOSIS — O26853 Spotting complicating pregnancy, third trimester: Secondary | ICD-10-CM | POA: Diagnosis not present

## 2020-05-19 DIAGNOSIS — O9903 Anemia complicating the puerperium: Secondary | ICD-10-CM | POA: Diagnosis not present

## 2020-05-19 DIAGNOSIS — O99210 Obesity complicating pregnancy, unspecified trimester: Secondary | ICD-10-CM | POA: Diagnosis present

## 2020-05-19 DIAGNOSIS — Z98891 History of uterine scar from previous surgery: Secondary | ICD-10-CM | POA: Diagnosis present

## 2020-05-19 DIAGNOSIS — Z6841 Body Mass Index (BMI) 40.0 and over, adult: Secondary | ICD-10-CM

## 2020-05-19 DIAGNOSIS — Z87891 Personal history of nicotine dependence: Secondary | ICD-10-CM

## 2020-05-19 DIAGNOSIS — O2343 Unspecified infection of urinary tract in pregnancy, third trimester: Secondary | ICD-10-CM | POA: Diagnosis not present

## 2020-05-19 DIAGNOSIS — O320XX2 Maternal care for unstable lie, fetus 2: Secondary | ICD-10-CM | POA: Diagnosis not present

## 2020-05-19 HISTORY — DX: Twin pregnancy, dichorionic/diamniotic, third trimester: O30.043

## 2020-05-19 LAB — CBC WITH DIFFERENTIAL/PLATELET
Abs Immature Granulocytes: 0.12 10*3/uL — ABNORMAL HIGH (ref 0.00–0.07)
Basophils Absolute: 0 10*3/uL (ref 0.0–0.1)
Basophils Relative: 0 %
Eosinophils Absolute: 0.1 10*3/uL (ref 0.0–0.5)
Eosinophils Relative: 0 %
HCT: 31.6 % — ABNORMAL LOW (ref 36.0–46.0)
Hemoglobin: 10.1 g/dL — ABNORMAL LOW (ref 12.0–15.0)
Immature Granulocytes: 1 %
Lymphocytes Relative: 5 %
Lymphs Abs: 0.9 10*3/uL (ref 0.7–4.0)
MCH: 27 pg (ref 26.0–34.0)
MCHC: 32 g/dL (ref 30.0–36.0)
MCV: 84.5 fL (ref 80.0–100.0)
Monocytes Absolute: 1.2 10*3/uL — ABNORMAL HIGH (ref 0.1–1.0)
Monocytes Relative: 7 %
Neutro Abs: 14.5 10*3/uL — ABNORMAL HIGH (ref 1.7–7.7)
Neutrophils Relative %: 87 %
Platelets: 247 10*3/uL (ref 150–400)
RBC: 3.74 MIL/uL — ABNORMAL LOW (ref 3.87–5.11)
RDW: 15 % (ref 11.5–15.5)
WBC: 16.8 10*3/uL — ABNORMAL HIGH (ref 4.0–10.5)
nRBC: 0.2 % (ref 0.0–0.2)

## 2020-05-19 LAB — URINALYSIS, ROUTINE W REFLEX MICROSCOPIC
Bilirubin Urine: NEGATIVE
Glucose, UA: NEGATIVE mg/dL
Hgb urine dipstick: NEGATIVE
Ketones, ur: 80 mg/dL — AB
Nitrite: POSITIVE — AB
Protein, ur: 100 mg/dL — AB
Specific Gravity, Urine: 1.021 (ref 1.005–1.030)
WBC, UA: >50 WBC/hpf — ABNORMAL HIGH (ref 0–5)
pH: 6 (ref 5.0–8.0)

## 2020-05-19 LAB — COMPREHENSIVE METABOLIC PANEL
ALT: 14 U/L (ref 0–44)
AST: 19 U/L (ref 15–41)
Albumin: 2.8 g/dL — ABNORMAL LOW (ref 3.5–5.0)
Alkaline Phosphatase: 109 U/L (ref 38–126)
Anion gap: 11 (ref 5–15)
BUN: 5 mg/dL — ABNORMAL LOW (ref 6–20)
CO2: 19 mmol/L — ABNORMAL LOW (ref 22–32)
Calcium: 8.7 mg/dL — ABNORMAL LOW (ref 8.9–10.3)
Chloride: 104 mmol/L (ref 98–111)
Creatinine, Ser: 0.64 mg/dL (ref 0.44–1.00)
GFR, Estimated: >60 mL/min (ref >60)
Glucose, Bld: 108 mg/dL — ABNORMAL HIGH (ref 70–99)
Potassium: 3.5 mmol/L (ref 3.5–5.1)
Sodium: 134 mmol/L — ABNORMAL LOW (ref 135–145)
Total Bilirubin: 1.2 mg/dL (ref 0.3–1.2)
Total Protein: 6.3 g/dL — ABNORMAL LOW (ref 6.5–8.1)

## 2020-05-19 LAB — PROTEIN / CREATININE RATIO, URINE
Creatinine, Urine: 186.02 mg/dL
Protein Creatinine Ratio: 0.59 mg/mg{Cre} — ABNORMAL HIGH (ref 0.00–0.15)
Total Protein, Urine: 109 mg/dL

## 2020-05-19 LAB — TYPE AND SCREEN
ABO/RH(D): A POS
Antibody Screen: NEGATIVE

## 2020-05-19 LAB — RESP PANEL BY RT-PCR (FLU A&B, COVID) ARPGX2
Influenza A by PCR: NEGATIVE
Influenza B by PCR: NEGATIVE
SARS Coronavirus 2 by RT PCR: NEGATIVE

## 2020-05-19 MED ORDER — SODIUM CHLORIDE 0.9 % IV SOLN
2.0000 g | INTRAVENOUS | Status: DC
  Administered 2020-05-19 – 2020-05-20 (×2): 2 g via INTRAVENOUS
  Filled 2020-05-19 (×3): qty 20

## 2020-05-19 MED ORDER — HYDROMORPHONE HCL 1 MG/ML IJ SOLN
1.0000 mg | INTRAMUSCULAR | Status: DC | PRN
  Administered 2020-05-19 – 2020-05-21 (×6): 1 mg via INTRAVENOUS
  Filled 2020-05-19 (×6): qty 1

## 2020-05-19 MED ORDER — ACETAMINOPHEN 500 MG PO TABS
1000.0000 mg | ORAL_TABLET | Freq: Once | ORAL | Status: AC
  Administered 2020-05-19: 1000 mg via ORAL
  Filled 2020-05-19: qty 2

## 2020-05-19 MED ORDER — ACETAMINOPHEN 325 MG PO TABS
650.0000 mg | ORAL_TABLET | ORAL | Status: DC | PRN
  Administered 2020-05-20 (×4): 650 mg via ORAL
  Filled 2020-05-19 (×4): qty 2

## 2020-05-19 MED ORDER — LABETALOL HCL 5 MG/ML IV SOLN: 40.0000 mg | INTRAVENOUS | Status: DC | PRN

## 2020-05-19 MED ORDER — LACTATED RINGERS IV SOLN: INTRAVENOUS | Status: DC

## 2020-05-19 MED ORDER — ACETAMINOPHEN 500 MG PO TABS
1000.0000 mg | ORAL_TABLET | Freq: Once | ORAL | Status: AC
  Administered 2020-05-20: 1000 mg via ORAL
  Filled 2020-05-19: qty 2

## 2020-05-19 MED ORDER — BETAMETHASONE SOD PHOS & ACET 6 (3-3) MG/ML IJ SUSP: 12.0000 mg | INTRAMUSCULAR | Status: DC

## 2020-05-19 MED ORDER — LABETALOL HCL 5 MG/ML IV SOLN: 80.0000 mg | INTRAVENOUS | Status: DC | PRN

## 2020-05-19 MED ORDER — PRENATAL MULTIVITAMIN CH
1.0000 | ORAL_TABLET | Freq: Every day | ORAL | Status: DC
  Administered 2020-05-20: 1 via ORAL
  Filled 2020-05-19: qty 1

## 2020-05-19 MED ORDER — DOCUSATE SODIUM 100 MG PO CAPS
100.0000 mg | ORAL_CAPSULE | Freq: Every day | ORAL | Status: DC
  Administered 2020-05-22 – 2020-05-24 (×3): 100 mg via ORAL
  Filled 2020-05-19 (×6): qty 1

## 2020-05-19 MED ORDER — LABETALOL HCL 5 MG/ML IV SOLN: 20.0000 mg | INTRAVENOUS | Status: DC | PRN

## 2020-05-19 MED ORDER — CALCIUM CARBONATE ANTACID 500 MG PO CHEW: 2.0000 | CHEWABLE_TABLET | ORAL | Status: DC | PRN

## 2020-05-19 MED ORDER — HYDRALAZINE HCL 20 MG/ML IJ SOLN: 10.0000 mg | INTRAMUSCULAR | Status: DC | PRN

## 2020-05-19 MED ORDER — HYDROMORPHONE HCL 2 MG PO TABS
2.0000 mg | ORAL_TABLET | Freq: Four times a day (QID) | ORAL | Status: DC | PRN
  Administered 2020-05-19: 2 mg via ORAL
  Administered 2020-05-20: 4 mg via ORAL
  Administered 2020-05-20 (×3): 2 mg via ORAL
  Administered 2020-05-21: 4 mg via ORAL
  Filled 2020-05-19 (×2): qty 2
  Filled 2020-05-19 (×3): qty 1
  Filled 2020-05-19: qty 2

## 2020-05-19 MED ORDER — HYDROMORPHONE HCL 1 MG/ML IJ SOLN
1.0000 mg | Freq: Once | INTRAMUSCULAR | Status: AC
Start: 2020-05-19 — End: 2020-05-19
  Administered 2020-05-19: 1 mg via INTRAVENOUS
  Filled 2020-05-19: qty 1

## 2020-05-19 MED ORDER — LACTATED RINGERS IV BOLUS
1000.0000 mL | Freq: Once | INTRAVENOUS | Status: AC
  Administered 2020-05-19 (×2): 1000 mL via INTRAVENOUS

## 2020-05-19 MED ORDER — ZOLPIDEM TARTRATE 5 MG PO TABS: 5.0000 mg | ORAL_TABLET | Freq: Every evening | ORAL | Status: DC | PRN

## 2020-05-19 MED ORDER — SODIUM CHLORIDE 0.9 % IV SOLN: INTRAVENOUS | Status: DC

## 2020-05-19 NOTE — MAU Provider Note (Addendum)
History     CSN: [id number]  Arrival date and time: 05/19/20 1759   Event Date/Time   First Provider Initiated Contact with Patient 05/19/20 1830      Chief Complaint  Patient presents with  . Fever  . Flank Pain  . Tachycardia   HPI Cathy Roman is a 25 y.o. G1P0 at [redacted]w[redacted]d who presents with fever, tachycardia and back pain. She states the symptoms started 2 days ago and have progressively gotten worse. She reports some elevated blood pressures over night and denies any hx of hypertension in the pregnancy. She denies HA, visual changes or epigastric pain. She reports the back pain is in her mid back and radiates down to her lower abdomen. She reports the pain is constant and she rates it a 7/10. She has not tried anything for the pain. She reports the fever started this morning and the tachycardia started with the fever. She has not have a COVID or flu vaccine and has not had COVID or the flu. She denies any congestion, sore throat or shortness of breath. She denies any urinary complaints. She denies any bleeding or leaking. Reports normal fetal movement of both babies.   OB History    Gravida  1   Para      Term      Preterm      AB      Living        SAB      IAB      Ectopic      Multiple      Live Births              Past Medical History:  Diagnosis Date  . ADHD    ADD  . Anxiety disorder   . Chronic migraine without aura, intractable, without status migrainosus   . Depression   . Former cigarette smoker   . Psychiatric disorder     Past Surgical History:  Procedure Laterality Date  . NONE      Family History  Problem Relation Age of Onset  . Mental illness Neg Hx     Social History   Tobacco Use  . Smoking status: Former Games developer  . Smokeless tobacco: Never Used  Vaping Use  . Vaping Use: Never used  Substance Use Topics  . Alcohol use: No  . Drug use: No    Allergies:  Allergies  Allergen Reactions  . Sulfa Antibiotics Hives     Medications Prior to Admission  Medication Sig Dispense Refill Last Dose  . buPROPion (WELLBUTRIN XL) 150 MG 24 hr tablet Take 1 tablet (150 mg total) by mouth daily. 30 tablet 0   . FLUoxetine (PROZAC) 10 MG capsule      . FLUoxetine (PROZAC) 20 MG capsule      . methylphenidate (CONCERTA) 54 MG PO CR tablet      . methylphenidate (RITALIN) 20 MG tablet      . METOPROLOL TARTRATE PO Take 50 mg by mouth 2 (two) times daily.     . Norgestimate-Ethinyl Estradiol Triphasic (TRINESSA, 28,) 0.18/0.215/0.25 MG-35 MCG tablet Take 1 tablet by mouth daily.     . traZODone (DESYREL) 50 MG tablet Take 0.5 tablets (25 mg total) by mouth at bedtime as needed for sleep (insomnia). 30 tablet 0     Review of Systems  Constitutional: Positive for chills, fatigue and fever.  HENT: Positive for congestion.   Respiratory: Negative.  Negative for shortness of breath.  Cardiovascular: Negative.  Negative for chest pain.  Gastrointestinal: Positive for abdominal pain. Negative for constipation, diarrhea, nausea and vomiting.  Genitourinary: Negative.  Negative for dysuria, vaginal bleeding and vaginal discharge.  Musculoskeletal: Positive for back pain.  Neurological: Negative.  Negative for dizziness and headaches.   Physical Exam   Blood pressure (!) 146/74, pulse (!) 148, temperature 100.3 F (37.9 C), resp. rate (!) 26, last menstrual period 10/20/2019.  Physical Exam Vitals and nursing note reviewed.  Constitutional:      General: She is not in acute distress.    Appearance: She is well-developed. She is ill-appearing.  HENT:     Head: Normocephalic.     Nose: Congestion present.  Eyes:     Pupils: Pupils are equal, round, and reactive to light.  Cardiovascular:     Rate and Rhythm: Normal rate and regular rhythm.     Heart sounds: Normal heart sounds.  Pulmonary:     Effort: Pulmonary effort is normal. No respiratory distress.     Breath sounds: Normal breath sounds.  Abdominal:      General: Bowel sounds are normal. There is no distension.     Palpations: Abdomen is soft.     Tenderness: There is no abdominal tenderness. There is no right CVA tenderness, left CVA tenderness, guarding or rebound.  Skin:    General: Skin is warm and dry.  Neurological:     Mental Status: She is alert and oriented to person, place, and time.  Psychiatric:        Behavior: Behavior normal.        Thought Content: Thought content normal.        Judgment: Judgment normal.     MAU Course  Procedures No results found for this or any previous visit (from the past 24 hour(s)).   MDM Prenatal records from private office reviewed. Pregnancy complicated by di/di twins and UTI in pregnancy. Labs ordered and reviewed.  UA Patient and babies tachycardic upon arrival. CNM called to room immediately for assessment.   LR bolus CBC with Diff, CMP, Protein/creat ratio COVID/Influenza swab Tylenol 1000mg  PO  Care turned over to L. Leftwich-Kirby CNM at 1900. , CNM 05/19/20 6:51 PM   Assessment and Plan    MDM:   Dilaudid 1 mg IV given for pain. COVID and flu negative, u/a with nitrites, 80 ketones.  BP remains elevated, 1 in severe range. Cervix 1/40/ballotable with irritability on FHR monitor.  Consult Dr 07/19/20 with assessment and findings. Admit to [facility] Unit for pyelonephritis. Blood cultures x 2, start Rocephin 2 g Q 24 hours.  Renal Vergie Living ordered. Dr [place] to see pt and reevaluate cervix.  Pt stable at time of transfer to HROB Unit.   A/P: 1. Pyelonephritis affecting pregnancy in third trimester   2. Dichorionic diamniotic twin pregnancy in third trimester   3. Elevated blood pressure affecting pregnancy in third trimester, antepartum    Admit to HROB Unit  Vergie Living, CNM 8:18 PM\

## 2020-05-19 NOTE — Progress Notes (Signed)
OB Update Note  Patient stable. She has continue mid low back pain, no real UCs. No chest pain but with some SOB. No VB or s/s of pre-eclampsia. Rocephin just got started  Appears anxious  94% on RA but when takes normal breaths patient 99-100% RR mid 20s   CTAB Normal s1 and s2, no MRGs. Tachy to the 120s Abdomen: obese, nttp SVE: 1cm externally, closed internally/long/high, no blood on glove. Pt does feel warm   RN putting back on EFM now but twins appeared to be coming down in FHR  F/u s/s as abx just got started. Pt received apap at 1900. Continue MIVF at 142mL/hr.  pCXR ordered as pulmonary issues can be associated with pyelo. Renal ultrasound negative.  BPs mild range and a few in the room were right at 160 SBP. Pt denies any issues with BPs in the past and I don't see anything either.  Pain medications ordered to see if we can get her back down to mild range with better pain control.   Cornelia Copa MD Attending Center [facility] [employer] (Faculty Practice) 05/19/2020 Time: 2220

## 2020-05-20 DIAGNOSIS — O99213 Obesity complicating pregnancy, third trimester: Secondary | ICD-10-CM | POA: Diagnosis not present

## 2020-05-20 DIAGNOSIS — O2343 Unspecified infection of urinary tract in pregnancy, third trimester: Secondary | ICD-10-CM | POA: Diagnosis not present

## 2020-05-20 DIAGNOSIS — Z3A30 30 weeks gestation of pregnancy: Secondary | ICD-10-CM

## 2020-05-20 DIAGNOSIS — O2303 Infections of kidney in pregnancy, third trimester: Secondary | ICD-10-CM

## 2020-05-20 DIAGNOSIS — O99210 Obesity complicating pregnancy, unspecified trimester: Secondary | ICD-10-CM | POA: Diagnosis present

## 2020-05-20 DIAGNOSIS — O30043 Twin pregnancy, dichorionic/diamniotic, third trimester: Secondary | ICD-10-CM

## 2020-05-20 DIAGNOSIS — Z6841 Body Mass Index (BMI) 40.0 and over, adult: Secondary | ICD-10-CM | POA: Insufficient documentation

## 2020-05-20 LAB — BLOOD CULTURE ID PANEL (REFLEXED) - BCID2

## 2020-05-20 LAB — COMPREHENSIVE METABOLIC PANEL
ALT: 15 U/L (ref 0–44)
AST: 25 U/L (ref 15–41)
Albumin: 2.6 g/dL — ABNORMAL LOW (ref 3.5–5.0)
Alkaline Phosphatase: 98 U/L (ref 38–126)
Anion gap: 16 — ABNORMAL HIGH (ref 5–15)
BUN: 6 mg/dL (ref 6–20)
CO2: 17 mmol/L — ABNORMAL LOW (ref 22–32)
Calcium: 8.9 mg/dL (ref 8.9–10.3)
Chloride: 101 mmol/L (ref 98–111)
Creatinine, Ser: 0.82 mg/dL (ref 0.44–1.00)
GFR, Estimated: >60 mL/min (ref >60)
Glucose, Bld: 96 mg/dL (ref 70–99)
Potassium: 4.1 mmol/L (ref 3.5–5.1)
Sodium: 134 mmol/L — ABNORMAL LOW (ref 135–145)
Total Bilirubin: 1 mg/dL (ref 0.3–1.2)
Total Protein: 6.1 g/dL — ABNORMAL LOW (ref 6.5–8.1)

## 2020-05-20 LAB — CBC WITH DIFFERENTIAL/PLATELET
Abs Immature Granulocytes: 0.24 10*3/uL — ABNORMAL HIGH (ref 0.00–0.07)
Basophils Absolute: 0.1 10*3/uL (ref 0.0–0.1)
Basophils Relative: 0 %
Eosinophils Absolute: 0 10*3/uL (ref 0.0–0.5)
Eosinophils Relative: 0 %
HCT: 31.2 % — ABNORMAL LOW (ref 36.0–46.0)
Hemoglobin: 9.8 g/dL — ABNORMAL LOW (ref 12.0–15.0)
Immature Granulocytes: 1 %
Lymphocytes Relative: 4 %
Lymphs Abs: 1.1 10*3/uL (ref 0.7–4.0)
MCH: 26.8 pg (ref 26.0–34.0)
MCHC: 31.4 g/dL (ref 30.0–36.0)
MCV: 85.2 fL (ref 80.0–100.0)
Monocytes Absolute: 1.8 10*3/uL — ABNORMAL HIGH (ref 0.1–1.0)
Monocytes Relative: 7 %
Neutro Abs: 21.4 10*3/uL — ABNORMAL HIGH (ref 1.7–7.7)
Neutrophils Relative %: 88 %
Platelets: 219 10*3/uL (ref 150–400)
RBC: 3.66 MIL/uL — ABNORMAL LOW (ref 3.87–5.11)
RDW: 15.5 % (ref 11.5–15.5)
WBC: 24.6 10*3/uL — ABNORMAL HIGH (ref 4.0–10.5)
nRBC: 0.1 % (ref 0.0–0.2)

## 2020-05-20 MED ORDER — ACETAMINOPHEN 500 MG PO TABS
1000.0000 mg | ORAL_TABLET | ORAL | Status: DC | PRN
  Filled 2020-05-20: qty 2

## 2020-05-20 MED ORDER — ONDANSETRON HCL 4 MG/2ML IJ SOLN
4.0000 mg | Freq: Four times a day (QID) | INTRAMUSCULAR | Status: DC
  Administered 2020-05-20: 4 mg via INTRAVENOUS

## 2020-05-20 MED ORDER — ESCITALOPRAM OXALATE 10 MG PO TABS
10.0000 mg | ORAL_TABLET | Freq: Every day | ORAL | Status: DC
  Administered 2020-05-20 – 2020-05-21 (×2): 10 mg via ORAL
  Filled 2020-05-20 (×2): qty 1

## 2020-05-20 MED ORDER — ONDANSETRON HCL 4 MG/2ML IJ SOLN
INTRAMUSCULAR | Status: AC
  Filled 2020-05-20: qty 2

## 2020-05-20 MED ORDER — LACTATED RINGERS IV BOLUS
500.0000 mL | Freq: Once | INTRAVENOUS | Status: AC
  Administered 2020-05-20: 500 mL via INTRAVENOUS

## 2020-05-20 MED ORDER — ONDANSETRON HCL 4 MG/2ML IJ SOLN: 4.0000 mg | Freq: Four times a day (QID) | INTRAMUSCULAR | Status: DC | PRN

## 2020-05-20 NOTE — Progress Notes (Signed)
PHARMACY - PHYSICIAN COMMUNICATION CRITICAL VALUE ALERT - BLOOD CULTURE IDENTIFICATION (BCID)  Cathy Roman is an 25 y.o. female [redacted]w[redacted]d gestationwho presented to [facility] on 05/19/2020 with a chief complaint of pyelonephritis   Assessment: Blood culture is positive for GNR, BCID - E.coli, Urine culture is still pending Name of physician (or Provider) Contacted: Dr. Macon Large   Current antibiotics: Pt is on Rocephin 2g Q 24 hrs, should be appropriately covered  Changes to prescribed antibiotics recommended:  Patient is on recommended antibiotics - No changes needed  Results for orders placed or performed during the hospital encounter of 05/19/20  Blood Culture ID Panel (Reflexed) (Collected: 05/19/2020  8:01 PM)  Result Value Ref Range   Enterococcus faecalis NOT DETECTED NOT DETECTED   Enterococcus Faecium NOT DETECTED NOT DETECTED   Listeria monocytogenes NOT DETECTED NOT DETECTED   Staphylococcus species NOT DETECTED NOT DETECTED   Staphylococcus aureus (BCID) NOT DETECTED NOT DETECTED   Staphylococcus epidermidis NOT DETECTED NOT DETECTED   Staphylococcus lugdunensis NOT DETECTED NOT DETECTED   Streptococcus species NOT DETECTED NOT DETECTED   Streptococcus agalactiae NOT DETECTED NOT DETECTED   Streptococcus pneumoniae NOT DETECTED NOT DETECTED   Streptococcus pyogenes NOT DETECTED NOT DETECTED   A.calcoaceticus-baumannii NOT DETECTED NOT DETECTED   Bacteroides fragilis NOT DETECTED NOT DETECTED   Enterobacterales DETECTED (A) NOT DETECTED   Enterobacter cloacae complex NOT DETECTED NOT DETECTED   Escherichia coli DETECTED (A) NOT DETECTED   Klebsiella aerogenes NOT DETECTED NOT DETECTED   Klebsiella oxytoca NOT DETECTED NOT DETECTED   Klebsiella pneumoniae NOT DETECTED NOT DETECTED   Proteus species NOT DETECTED NOT DETECTED   Salmonella species NOT DETECTED NOT DETECTED   Serratia marcescens NOT DETECTED NOT DETECTED   Haemophilus influenzae NOT DETECTED NOT DETECTED    Neisseria meningitidis NOT DETECTED NOT DETECTED   Pseudomonas aeruginosa NOT DETECTED NOT DETECTED   Stenotrophomonas maltophilia NOT DETECTED NOT DETECTED   Candida albicans NOT DETECTED NOT DETECTED   Candida auris NOT DETECTED NOT DETECTED   Candida glabrata NOT DETECTED NOT DETECTED   Candida krusei NOT DETECTED NOT DETECTED   Candida parapsilosis NOT DETECTED NOT DETECTED   Candida tropicalis NOT DETECTED NOT DETECTED   Cryptococcus neoformans/gattii NOT DETECTED NOT DETECTED   CTX-M ESBL NOT DETECTED NOT DETECTED   Carbapenem resistance IMP NOT DETECTED NOT DETECTED   Carbapenem resistance KPC NOT DETECTED NOT DETECTED   Carbapenem resistance NDM NOT DETECTED NOT DETECTED   Carbapenem resist OXA 48 LIKE NOT DETECTED NOT DETECTED   Carbapenem resistance VIM NOT DETECTED NOT DETECTED    Riki Rusk 05/20/2020  12:31 PM

## 2020-05-20 NOTE — Progress Notes (Signed)
Daily Antepartum Note  Admission Date: 05/19/2020 Current Date: 05/20/2020 7:39 AM  Cathy Roman is a 25 y.o. G1P0 @ [redacted]w[redacted]d, HD#2, admitted for pyelo (back pain, fevers, chills, dirty urine).  Pregnancy complicated by: Patient Active Problem List   Diagnosis Date Noted  . BMI 40.0-44.9, adult (HCC) 05/20/2020  . Obesity in pregnancy 05/20/2020  . UTI in pregnancy 05/19/2020  . Pyelonephritis affecting pregnancy in third trimester 05/19/2020  . Elevated blood pressure affecting pregnancy in third trimester, antepartum 05/19/2020  . Dichorionic diamniotic twin gestation 05/19/2020  . Major depressive disorder, single episode, severe without psychotic features (HCC) 02/28/2014  . MDD (major depressive disorder), single episode, severe , no psychosis (HCC) 02/28/2014    Overnight/24hr events:  None  Subjective:  Patient feels stable with stable low back pain   Objective:     Current Vital Signs 24h Vital Sign Ranges  T 99 F (37.2 C) Temp  Avg: 100.1 F (37.8 C)  Min: 98.4 F (36.9 C)  Max: 101.4 F (38.6 C)  BP 129/80 BP  Min: 123/52  Max: 163/76  HR (!) 129 Pulse  Avg: 136.2  Min: 124  Max: 148  RR 20 Resp  Avg: 27.2  Min: 20  Max: 40  SaO2 100 % Nasal Cannula SpO2  Avg: 96 %  Min: 93 %  Max: 100 %       24 Hour I/O Current Shift I/O  Time Ins Outs 05/01 0701 - 05/02 0700 In: 1656.2 [P.O.:360; I.V.:1270.5] Out: 700 [Urine:700] 05/02 0701 - 05/02 1900 In: -  Out: 650 [Urine:650]   Patient Vitals for the past 24 hrs:  BP Temp Temp src Pulse Resp SpO2 Height Weight  05/20/20 0723 129/80 99 F (37.2 C) Oral (!) 129 20 100 % -- --  05/20/20 0542 -- -- -- -- -- -- 5\' 4"  (1.626 m) 121.2 kg  05/20/20 0452 129/77 98.4 F (36.9 C) Oral (!) 124 20 97 % -- --  05/20/20 0451 -- -- -- -- -- 97 % -- --  05/20/20 0301 -- -- -- -- -- 95 % -- --  05/20/20 0222 -- (!) 101.4 F (38.6 C) Oral -- -- -- -- --  05/20/20 0142 -- (!) 101.2 F (38.4 C) Oral -- -- -- -- --  05/19/20  2311 -- -- -- -- -- 95 % -- --  05/19/20 2307 125/63 99.6 F (37.6 C) Oral (!) 139 (!) 30 -- -- --  05/19/20 2205 -- (!) 100.6 F (38.1 C) Axillary -- -- -- -- --  05/19/20 2159 (!) 161/91 99.8 F (37.7 C) Oral (!) 135 (!) 40 -- -- --  05/19/20 2046 -- -- -- -- -- 97 % -- --  05/19/20 2036 (!) 123/52 99.3 F (37.4 C) Oral (!) 139 -- -- -- --  05/19/20 1955 -- -- -- -- -- 93 % -- --  05/19/20 1950 -- -- -- -- -- 94 % -- --  05/19/20 1946 (!) 141/64 (!) 101 F (38.3 C) Oral (!) 134 -- -- -- --  05/19/20 1931 (!) 159/65 -- -- (!) 132 -- -- -- --  05/19/20 1916 (!) 163/76 -- -- (!) 142 -- -- -- --  05/19/20 1846 (!) 142/68 -- -- (!) 131 -- -- -- --  05/19/20 1844 140/71 -- -- (!) 145 -- -- -- --  05/19/20 1820 (!) 146/74 100.3 F (37.9 C) -- (!) 148 (!) 26 -- -- --   UOP:>172mL/hr  FHT A 150 baseline, no accel,  no decel, mod variability B 165 baseline, ?accel, no decel, mod varability Tocometry Quiet  Physical exam: General: Well nourished, well developed female in no acute distress. Patient looks more comfortable than on admission Abdomen: gravid, obese nttp Cardiovascular: S1, S2 normal, no murmur, rub or gallop, regular rate and rhythm Respiratory: CTAB. [place] removed in the room and patient 99-100%. RR normal now Extremities: no clubbing, cyanosis or edema Skin: Warm and dry.   Medications: Current Facility-Administered Medications  Medication Dose Route Frequency Provider Last Rate Last Admin  . acetaminophen (TYLENOL) tablet 650 mg  650 mg Oral Q4H PRN Leftwich-Kirby, Wilmer Floor, CNM      . calcium carbonate (TUMS - dosed in mg elemental calcium) chewable tablet 400 mg of elemental calcium  2 tablet Oral Q4H PRN Leftwich-Kirby, Wilmer Floor, CNM      . cefTRIAXone (ROCEPHIN) 2 g in sodium chloride 0.9 % 100 mL IVPB  2 g Intravenous Q24H Leftwich-Kirby, Lisa A, CNM   Stopped at 05/19/20 2222  . docusate sodium (COLACE) capsule 100 mg  100 mg Oral Daily Leftwich-Kirby, Lisa A, CNM       . labetalol (NORMODYNE) injection 20 mg  20 mg Intravenous PRN Leftwich-Kirby, Lisa A, CNM       And  . labetalol (NORMODYNE) injection 40 mg  40 mg Intravenous PRN Leftwich-Kirby, Lisa A, CNM       And  . labetalol (NORMODYNE) injection 80 mg  80 mg Intravenous PRN Leftwich-Kirby, Lisa A, CNM       And  . hydrALAZINE (APRESOLINE) injection 10 mg  10 mg Intravenous PRN Leftwich-Kirby, Wilmer Floor, CNM      . HYDROmorphone (DILAUDID) injection 1 mg  1 mg Intravenous Q2H PRN [place] Bing, MD   1 mg at 05/20/20 0256  . HYDROmorphone (DILAUDID) tablet 2-4 mg  2-4 mg Oral Q6H PRN [place] Bing, MD   2 mg at 05/19/20 2357  . lactated ringers infusion   Intravenous Continuous [place] Bing, MD 150 mL/hr at 05/20/20 0541 New Bag at 05/20/20 0541  . prenatal multivitamin tablet 1 tablet  1 tablet Oral Q1200 Leftwich-Kirby, Lisa A, CNM        Labs:  Pending: BCx x 2, UCx  Recent Labs  Lab 05/19/20 1831  WBC 16.8*  HGB 10.1*  HCT 31.6*  PLT 247    Recent Labs  Lab 05/19/20 1831  NA 134*  K 3.5  CL 104  CO2 19*  BUN 5*  CREATININE 0.64  CALCIUM 8.7*  PROT 6.3*  BILITOT 1.2  ALKPHOS 109  ALT 14  AST 19  GLUCOSE 108*     Radiology:  No new imaging  Assessment & Plan:  Patient improving *Pregnancy: category II due to still slight tachycardia with baby B but EFM much improved and I suspect it will continue with rehydration and antibiotics continuing to work. Maternal fetal status overall reassuring. Can come off EFM once both are category I with accels *Pyelo: continue rocephin, aggressive IVF hydration. Follow up rpt labs for this morning.  *?gHTN: BPs are normalizing with improvement of infection. Continue to follow.  *Preterm: no current issues.  *PPx: lovenox, oob ad lib *FEN/GI: regular diet, MIVF *Dispo: I d/w her I recommend waiting until culture results are back before d/c  Cornelia Copa MD Attending Center for [facility] (Faculty Practice) GYN  Consult Phone: [phone] (M-F, 0800-1700) & [phone] (Off hours, weekends, holidays)

## 2020-05-21 ENCOUNTER — Encounter (HOSPITAL_COMMUNITY): Payer: Self-pay | Admitting: Obstetrics and Gynecology

## 2020-05-21 ENCOUNTER — Inpatient Hospital Stay (HOSPITAL_COMMUNITY): Payer: BC Managed Care – PPO | Admitting: Anesthesiology

## 2020-05-21 ENCOUNTER — Inpatient Hospital Stay (HOSPITAL_BASED_OUTPATIENT_CLINIC_OR_DEPARTMENT_OTHER): Payer: BC Managed Care – PPO

## 2020-05-21 ENCOUNTER — Encounter (HOSPITAL_COMMUNITY)
Admission: AD | Disposition: A | Discharge status: Home / Self Care | Payer: Self-pay | Source: Home / Self Care | Attending: Obstetrics and Gynecology

## 2020-05-21 DIAGNOSIS — O8621 Infection of kidney following delivery: Secondary | ICD-10-CM | POA: Diagnosis not present

## 2020-05-21 DIAGNOSIS — O9903 Anemia complicating the puerperium: Secondary | ICD-10-CM | POA: Diagnosis not present

## 2020-05-21 DIAGNOSIS — O321XX1 Maternal care for breech presentation, fetus 1: Secondary | ICD-10-CM | POA: Diagnosis not present

## 2020-05-21 DIAGNOSIS — B9620 Unspecified Escherichia coli [E. coli] as the cause of diseases classified elsewhere: Secondary | ICD-10-CM | POA: Diagnosis present

## 2020-05-21 DIAGNOSIS — R000 Tachycardia, unspecified: Secondary | ICD-10-CM | POA: Diagnosis present

## 2020-05-21 DIAGNOSIS — N12 Tubulo-interstitial nephritis, not specified as acute or chronic: Secondary | ICD-10-CM | POA: Diagnosis present

## 2020-05-21 DIAGNOSIS — R7881 Bacteremia: Secondary | ICD-10-CM | POA: Diagnosis not present

## 2020-05-21 DIAGNOSIS — O321XX2 Maternal care for breech presentation, fetus 2: Secondary | ICD-10-CM | POA: Diagnosis not present

## 2020-05-21 DIAGNOSIS — O2303 Infections of kidney in pregnancy, third trimester: Secondary | ICD-10-CM | POA: Diagnosis not present

## 2020-05-21 DIAGNOSIS — Z98891 History of uterine scar from previous surgery: Secondary | ICD-10-CM | POA: Diagnosis present

## 2020-05-21 DIAGNOSIS — O9942 Diseases of the circulatory system complicating childbirth: Secondary | ICD-10-CM | POA: Diagnosis present

## 2020-05-21 DIAGNOSIS — Z3A30 30 weeks gestation of pregnancy: Secondary | ICD-10-CM

## 2020-05-21 DIAGNOSIS — O4693 Antepartum hemorrhage, unspecified, third trimester: Secondary | ICD-10-CM | POA: Diagnosis not present

## 2020-05-21 DIAGNOSIS — R509 Fever, unspecified: Secondary | ICD-10-CM | POA: Diagnosis present

## 2020-05-21 DIAGNOSIS — K5900 Constipation, unspecified: Secondary | ICD-10-CM | POA: Diagnosis present

## 2020-05-21 DIAGNOSIS — Z20822 Contact with and (suspected) exposure to covid-19: Secondary | ICD-10-CM | POA: Diagnosis present

## 2020-05-21 DIAGNOSIS — O9902 Anemia complicating childbirth: Secondary | ICD-10-CM | POA: Diagnosis present

## 2020-05-21 DIAGNOSIS — O99892 Other specified diseases and conditions complicating childbirth: Secondary | ICD-10-CM | POA: Diagnosis present

## 2020-05-21 DIAGNOSIS — O30043 Twin pregnancy, dichorionic/diamniotic, third trimester: Secondary | ICD-10-CM | POA: Diagnosis not present

## 2020-05-21 DIAGNOSIS — D509 Iron deficiency anemia, unspecified: Secondary | ICD-10-CM | POA: Diagnosis not present

## 2020-05-21 DIAGNOSIS — O99214 Obesity complicating childbirth: Secondary | ICD-10-CM | POA: Diagnosis present

## 2020-05-21 DIAGNOSIS — Z87891 Personal history of nicotine dependence: Secondary | ICD-10-CM | POA: Diagnosis not present

## 2020-05-21 LAB — COMPREHENSIVE METABOLIC PANEL
ALT: 13 U/L (ref 0–44)
AST: 18 U/L (ref 15–41)
Albumin: 2.1 g/dL — ABNORMAL LOW (ref 3.5–5.0)
Alkaline Phosphatase: 103 U/L (ref 38–126)
Anion gap: 12 (ref 5–15)
BUN: <5 mg/dL — ABNORMAL LOW (ref 6–20)
CO2: 18 mmol/L — ABNORMAL LOW (ref 22–32)
Calcium: 8.9 mg/dL (ref 8.9–10.3)
Chloride: 103 mmol/L (ref 98–111)
Creatinine, Ser: 0.66 mg/dL (ref 0.44–1.00)
GFR, Estimated: >60 mL/min (ref >60)
Glucose, Bld: 92 mg/dL (ref 70–99)
Potassium: 3.7 mmol/L (ref 3.5–5.1)
Sodium: 133 mmol/L — ABNORMAL LOW (ref 135–145)
Total Bilirubin: 0.5 mg/dL (ref 0.3–1.2)
Total Protein: 5.2 g/dL — ABNORMAL LOW (ref 6.5–8.1)

## 2020-05-21 LAB — CBC
HCT: 28.6 % — ABNORMAL LOW (ref 36.0–46.0)
Hemoglobin: 9.2 g/dL — ABNORMAL LOW (ref 12.0–15.0)
MCH: 26.9 pg (ref 26.0–34.0)
MCHC: 32.2 g/dL (ref 30.0–36.0)
MCV: 83.6 fL (ref 80.0–100.0)
Platelets: 204 10*3/uL (ref 150–400)
RBC: 3.42 MIL/uL — ABNORMAL LOW (ref 3.87–5.11)
RDW: 15.5 % (ref 11.5–15.5)
WBC: 18.9 10*3/uL — ABNORMAL HIGH (ref 4.0–10.5)
nRBC: 0.1 % (ref 0.0–0.2)

## 2020-05-21 LAB — DIC (DISSEMINATED INTRAVASCULAR COAGULATION)PANEL
D-Dimer, Quant: 2.35 ug/mL-FEU — ABNORMAL HIGH (ref 0.00–0.50)
Fibrinogen: >800 mg/dL — ABNORMAL HIGH (ref 210–475)
INR: 1.1 (ref 0.8–1.2)
Platelets: 211 10*3/uL (ref 150–400)
Prothrombin Time: 14.5 seconds (ref 11.4–15.2)
Smear Review: NONE SEEN
aPTT: 33 seconds (ref 24–36)

## 2020-05-21 LAB — WET PREP, GENITAL
Clue Cells Wet Prep HPF POC: NONE SEEN
Sperm: NONE SEEN
Trich, Wet Prep: NONE SEEN
Yeast Wet Prep HPF POC: NONE SEEN

## 2020-05-21 LAB — CBC WITH DIFFERENTIAL/PLATELET
Abs Immature Granulocytes: 0.22 10*3/uL — ABNORMAL HIGH (ref 0.00–0.07)
Basophils Absolute: 0 10*3/uL (ref 0.0–0.1)
Basophils Relative: 0 %
Eosinophils Absolute: 0 10*3/uL (ref 0.0–0.5)
Eosinophils Relative: 0 %
HCT: 27.1 % — ABNORMAL LOW (ref 36.0–46.0)
Hemoglobin: 8.6 g/dL — ABNORMAL LOW (ref 12.0–15.0)
Immature Granulocytes: 1 %
Lymphocytes Relative: 8 %
Lymphs Abs: 1.5 10*3/uL (ref 0.7–4.0)
MCH: 27.1 pg (ref 26.0–34.0)
MCHC: 31.7 g/dL (ref 30.0–36.0)
MCV: 85.5 fL (ref 80.0–100.0)
Monocytes Absolute: 2.3 10*3/uL — ABNORMAL HIGH (ref 0.1–1.0)
Monocytes Relative: 11 %
Neutro Abs: 16 10*3/uL — ABNORMAL HIGH (ref 1.7–7.7)
Neutrophils Relative %: 80 %
Platelets: 200 10*3/uL (ref 150–400)
RBC: 3.17 MIL/uL — ABNORMAL LOW (ref 3.87–5.11)
RDW: 15.4 % (ref 11.5–15.5)
WBC: 20.1 10*3/uL — ABNORMAL HIGH (ref 4.0–10.5)
nRBC: 0.2 % (ref 0.0–0.2)

## 2020-05-21 SURGERY — Surgical Case
Anesthesia: Spinal | Site: Abdomen | Wound class: Clean Contaminated

## 2020-05-21 MED ORDER — ONDANSETRON HCL 4 MG/2ML IJ SOLN
INTRAMUSCULAR | Status: DC | PRN
  Administered 2020-05-21: 4 mg via INTRAVENOUS

## 2020-05-21 MED ORDER — MORPHINE SULFATE (PF) 0.5 MG/ML IJ SOLN
INTRAMUSCULAR | Status: DC | PRN
  Administered 2020-05-21: 150 ug via INTRATHECAL

## 2020-05-21 MED ORDER — PRENATAL MULTIVITAMIN CH
1.0000 | ORAL_TABLET | Freq: Every day | ORAL | Status: DC
  Administered 2020-05-22 – 2020-05-23 (×3): 1 via ORAL
  Filled 2020-05-21 (×3): qty 1

## 2020-05-21 MED ORDER — CHLOROPROCAINE HCL (PF) 3 % IJ SOLN
INTRAMUSCULAR | Status: AC
  Filled 2020-05-21: qty 20

## 2020-05-21 MED ORDER — MENTHOL 3 MG MT LOZG: 1.0000 | LOZENGE | OROMUCOSAL | Status: DC | PRN

## 2020-05-21 MED ORDER — PHENYLEPHRINE HCL-NACL 20-0.9 MG/250ML-% IV SOLN
INTRAVENOUS | Status: DC | PRN
  Administered 2020-05-21: 60 ug/min via INTRAVENOUS

## 2020-05-21 MED ORDER — FENTANYL CITRATE (PF) 100 MCG/2ML IJ SOLN
INTRAMUSCULAR | Status: DC | PRN
  Administered 2020-05-21: 85 ug via INTRAVENOUS

## 2020-05-21 MED ORDER — TETANUS-DIPHTH-ACELL PERTUSSIS 5-2.5-18.5 LF-MCG/0.5 IM SUSY: 0.5000 mL | PREFILLED_SYRINGE | Freq: Once | INTRAMUSCULAR | Status: DC

## 2020-05-21 MED ORDER — OXYCODONE HCL 5 MG PO TABS
5.0000 mg | ORAL_TABLET | ORAL | Status: DC | PRN
  Administered 2020-05-22 – 2020-05-24 (×5): 5 mg via ORAL
  Filled 2020-05-21 (×7): qty 1

## 2020-05-21 MED ORDER — ONDANSETRON HCL 4 MG/2ML IJ SOLN: 4.0000 mg | Freq: Three times a day (TID) | INTRAMUSCULAR | Status: DC | PRN

## 2020-05-21 MED ORDER — MAGNESIUM SULFATE 40 GM/1000ML IV SOLN
2.5000 g/h | INTRAVENOUS | Status: DC
  Administered 2020-05-21: 2 g/h via INTRAVENOUS
  Filled 2020-05-21: qty 1000

## 2020-05-21 MED ORDER — KETOROLAC TROMETHAMINE 30 MG/ML IJ SOLN
30.0000 mg | Freq: Four times a day (QID) | INTRAMUSCULAR | Status: AC
Start: 2020-05-22 — End: 2020-05-22
  Administered 2020-05-22 (×4): 30 mg via INTRAVENOUS
  Filled 2020-05-21 (×4): qty 1

## 2020-05-21 MED ORDER — SODIUM CHLORIDE 0.9 % IV SOLN
INTRAVENOUS | Status: AC
  Filled 2020-05-21: qty 500

## 2020-05-21 MED ORDER — CYCLOBENZAPRINE HCL 10 MG PO TABS
10.0000 mg | ORAL_TABLET | Freq: Three times a day (TID) | ORAL | Status: DC | PRN
  Administered 2020-05-21: 10 mg via ORAL
  Filled 2020-05-21: qty 1

## 2020-05-21 MED ORDER — NIFEDIPINE 10 MG PO CAPS: 10.0000 mg | ORAL_CAPSULE | Freq: Once | ORAL | Status: DC

## 2020-05-21 MED ORDER — NALOXONE HCL 0.4 MG/ML IJ SOLN: 0.4000 mg | INTRAMUSCULAR | Status: DC | PRN

## 2020-05-21 MED ORDER — SCOPOLAMINE 1 MG/3DAYS TD PT72
MEDICATED_PATCH | TRANSDERMAL | Status: AC
  Filled 2020-05-21: qty 1

## 2020-05-21 MED ORDER — OXYCODONE HCL 5 MG/5ML PO SOLN
5.0000 mg | Freq: Once | ORAL | Status: DC | PRN
Start: 2020-05-21 — End: 2020-05-21

## 2020-05-21 MED ORDER — DIPHENHYDRAMINE HCL 25 MG PO CAPS
25.0000 mg | ORAL_CAPSULE | ORAL | Status: DC | PRN
  Filled 2020-05-21: qty 1

## 2020-05-21 MED ORDER — LACTATED RINGERS IV SOLN: INTRAVENOUS | Status: DC | PRN

## 2020-05-21 MED ORDER — SODIUM CHLORIDE 0.9 % IV SOLN
2.0000 g | INTRAVENOUS | Status: DC
  Filled 2020-05-21: qty 20

## 2020-05-21 MED ORDER — SENNOSIDES-DOCUSATE SODIUM 8.6-50 MG PO TABS
1.0000 | ORAL_TABLET | Freq: Two times a day (BID) | ORAL | Status: DC | PRN
  Administered 2020-05-21: 2 via ORAL
  Filled 2020-05-21: qty 2

## 2020-05-21 MED ORDER — OXYCODONE HCL 5 MG PO TABS: 5.0000 mg | ORAL_TABLET | Freq: Once | ORAL | Status: DC | PRN

## 2020-05-21 MED ORDER — SOD CITRATE-CITRIC ACID 500-334 MG/5ML PO SOLN
ORAL | Status: AC
  Administered 2020-05-21: 30 mL
  Filled 2020-05-21: qty 15

## 2020-05-21 MED ORDER — DIBUCAINE (PERIANAL) 1 % EX OINT: 1.0000 "application " | TOPICAL_OINTMENT | CUTANEOUS | Status: DC | PRN

## 2020-05-21 MED ORDER — KETOROLAC TROMETHAMINE 30 MG/ML IJ SOLN
INTRAMUSCULAR | Status: AC
  Filled 2020-05-21: qty 1

## 2020-05-21 MED ORDER — FENTANYL CITRATE (PF) 100 MCG/2ML IJ SOLN: 25.0000 ug | INTRAMUSCULAR | Status: DC | PRN

## 2020-05-21 MED ORDER — SCOPOLAMINE 1 MG/3DAYS TD PT72
1.0000 | MEDICATED_PATCH | Freq: Once | TRANSDERMAL | Status: AC
  Administered 2020-05-21: 1.5 mg via TRANSDERMAL

## 2020-05-21 MED ORDER — ACETAMINOPHEN 500 MG PO TABS
1000.0000 mg | ORAL_TABLET | Freq: Three times a day (TID) | ORAL | Status: DC
  Administered 2020-05-21 – 2020-05-24 (×8): 1000 mg via ORAL
  Filled 2020-05-21 (×9): qty 2

## 2020-05-21 MED ORDER — POLYETHYLENE GLYCOL 3350 17 G PO PACK
17.0000 g | PACK | Freq: Every day | ORAL | Status: DC | PRN
  Administered 2020-05-21: 17 g via ORAL
  Filled 2020-05-21: qty 1

## 2020-05-21 MED ORDER — NALBUPHINE HCL 10 MG/ML IJ SOLN
5.0000 mg | INTRAMUSCULAR | Status: DC | PRN
Start: 2020-05-21 — End: 2020-05-24

## 2020-05-21 MED ORDER — DIPHENHYDRAMINE HCL 50 MG/ML IJ SOLN
12.5000 mg | Freq: Four times a day (QID) | INTRAMUSCULAR | Status: DC | PRN
  Administered 2020-05-21: 12.5 mg via INTRAVENOUS
  Filled 2020-05-21: qty 1

## 2020-05-21 MED ORDER — BUPIVACAINE IN DEXTROSE 0.75-8.25 % IT SOLN
INTRATHECAL | Status: DC | PRN
  Administered 2020-05-21: 1.6 mL via INTRATHECAL

## 2020-05-21 MED ORDER — DIPHENHYDRAMINE HCL 25 MG PO CAPS
25.0000 mg | ORAL_CAPSULE | Freq: Four times a day (QID) | ORAL | Status: DC | PRN
  Administered 2020-05-22: 25 mg via ORAL

## 2020-05-21 MED ORDER — WITCH HAZEL-GLYCERIN EX PADS: 1.0000 "application " | MEDICATED_PAD | CUTANEOUS | Status: DC | PRN

## 2020-05-21 MED ORDER — LACTATED RINGERS IV SOLN: INTRAVENOUS | Status: DC

## 2020-05-21 MED ORDER — SODIUM CHLORIDE 0.9 % IV SOLN
500.0000 mg | Freq: Once | INTRAVENOUS | Status: AC
  Administered 2020-05-21: 500 mg via INTRAVENOUS

## 2020-05-21 MED ORDER — NALBUPHINE HCL 10 MG/ML IJ SOLN
5.0000 mg | Freq: Once | INTRAMUSCULAR | Status: DC | PRN
Start: 2020-05-21 — End: 2020-05-24

## 2020-05-21 MED ORDER — DEXAMETHASONE SODIUM PHOSPHATE 10 MG/ML IJ SOLN
INTRAMUSCULAR | Status: DC | PRN
  Administered 2020-05-21: 10 mg via INTRAVENOUS

## 2020-05-21 MED ORDER — ZOLPIDEM TARTRATE 5 MG PO TABS
5.0000 mg | ORAL_TABLET | Freq: Every evening | ORAL | Status: DC | PRN
  Administered 2020-05-24: 5 mg via ORAL
  Filled 2020-05-21: qty 1

## 2020-05-21 MED ORDER — IBUPROFEN 600 MG PO TABS
600.0000 mg | ORAL_TABLET | Freq: Four times a day (QID) | ORAL | Status: DC
  Administered 2020-05-23 – 2020-05-24 (×5): 600 mg via ORAL
  Filled 2020-05-21 (×6): qty 1

## 2020-05-21 MED ORDER — DEXAMETHASONE SODIUM PHOSPHATE 10 MG/ML IJ SOLN
INTRAMUSCULAR | Status: AC
  Filled 2020-05-21: qty 1

## 2020-05-21 MED ORDER — COCONUT OIL OIL: 1.0000 "application " | TOPICAL_OIL | Status: DC | PRN

## 2020-05-21 MED ORDER — ACETAMINOPHEN 325 MG PO TABS
650.0000 mg | ORAL_TABLET | ORAL | Status: DC | PRN
  Administered 2020-05-21 (×2): 650 mg via ORAL
  Filled 2020-05-21: qty 2

## 2020-05-21 MED ORDER — OXYTOCIN-SODIUM CHLORIDE 30-0.9 UT/500ML-% IV SOLN
INTRAVENOUS | Status: DC | PRN
  Administered 2020-05-21: 350 mL via INTRAVENOUS

## 2020-05-21 MED ORDER — DEXTROSE 5 % IV SOLN
INTRAVENOUS | Status: DC | PRN
  Administered 2020-05-21: 3 g via INTRAVENOUS

## 2020-05-21 MED ORDER — NALBUPHINE HCL 10 MG/ML IJ SOLN: 5.0000 mg | Freq: Once | INTRAMUSCULAR | Status: DC | PRN

## 2020-05-21 MED ORDER — OXYTOCIN-SODIUM CHLORIDE 30-0.9 UT/500ML-% IV SOLN
INTRAVENOUS | Status: AC
  Filled 2020-05-21: qty 500

## 2020-05-21 MED ORDER — NIFEDIPINE 10 MG PO CAPS
10.0000 mg | ORAL_CAPSULE | Freq: Once | ORAL | Status: AC
  Administered 2020-05-21: 10 mg via ORAL
  Filled 2020-05-21: qty 1

## 2020-05-21 MED ORDER — DIPHENHYDRAMINE HCL 25 MG PO CAPS
50.0000 mg | ORAL_CAPSULE | Freq: Every evening | ORAL | Status: DC | PRN
  Administered 2020-05-21 (×2): 50 mg via ORAL
  Filled 2020-05-21 (×2): qty 2

## 2020-05-21 MED ORDER — OXYTOCIN-SODIUM CHLORIDE 30-0.9 UT/500ML-% IV SOLN: 2.5000 [IU]/h | INTRAVENOUS | Status: AC

## 2020-05-21 MED ORDER — SENNOSIDES-DOCUSATE SODIUM 8.6-50 MG PO TABS
2.0000 | ORAL_TABLET | ORAL | Status: DC
  Administered 2020-05-22 – 2020-05-24 (×2): 2 via ORAL
  Filled 2020-05-21 (×4): qty 2

## 2020-05-21 MED ORDER — PROMETHAZINE HCL 25 MG/ML IJ SOLN: 6.2500 mg | INTRAMUSCULAR | Status: DC | PRN

## 2020-05-21 MED ORDER — SIMETHICONE 80 MG PO CHEW
80.0000 mg | CHEWABLE_TABLET | Freq: Three times a day (TID) | ORAL | Status: DC
  Administered 2020-05-22 – 2020-05-24 (×7): 80 mg via ORAL
  Filled 2020-05-21 (×8): qty 1

## 2020-05-21 MED ORDER — MEPERIDINE HCL 25 MG/ML IJ SOLN: 6.2500 mg | INTRAMUSCULAR | Status: DC | PRN

## 2020-05-21 MED ORDER — NALOXONE HCL 4 MG/10ML IJ SOLN
1.0000 ug/kg/h | INTRAVENOUS | Status: DC | PRN
  Filled 2020-05-21: qty 5

## 2020-05-21 MED ORDER — SIMETHICONE 80 MG PO CHEW: 80.0000 mg | CHEWABLE_TABLET | ORAL | Status: DC | PRN

## 2020-05-21 MED ORDER — SOD CITRATE-CITRIC ACID 500-334 MG/5ML PO SOLN: 30.0000 mL | Freq: Once | ORAL | Status: DC

## 2020-05-21 MED ORDER — FENTANYL CITRATE (PF) 100 MCG/2ML IJ SOLN
INTRAMUSCULAR | Status: DC | PRN
  Administered 2020-05-21: 15 ug via INTRATHECAL

## 2020-05-21 MED ORDER — ONDANSETRON HCL 4 MG/2ML IJ SOLN
INTRAMUSCULAR | Status: AC
  Filled 2020-05-21: qty 2

## 2020-05-21 MED ORDER — ENOXAPARIN SODIUM 60 MG/0.6ML IJ SOSY
60.0000 mg | PREFILLED_SYRINGE | INTRAMUSCULAR | Status: DC
  Administered 2020-05-22 – 2020-05-24 (×3): 60 mg via SUBCUTANEOUS
  Filled 2020-05-21 (×3): qty 0.6

## 2020-05-21 MED ORDER — NALBUPHINE HCL 10 MG/ML IJ SOLN: 5.0000 mg | INTRAMUSCULAR | Status: DC | PRN

## 2020-05-21 MED ORDER — SODIUM CHLORIDE 0.9% FLUSH
3.0000 mL | INTRAVENOUS | Status: DC | PRN
  Administered 2020-05-22: 3 mL via INTRAVENOUS

## 2020-05-21 MED ORDER — BETAMETHASONE SOD PHOS & ACET 6 (3-3) MG/ML IJ SUSP
12.0000 mg | INTRAMUSCULAR | Status: DC
  Administered 2020-05-21: 12 mg via INTRAMUSCULAR
  Filled 2020-05-21: qty 5

## 2020-05-21 MED ORDER — MORPHINE SULFATE (PF) 0.5 MG/ML IJ SOLN
INTRAMUSCULAR | Status: AC
  Filled 2020-05-21: qty 10

## 2020-05-21 MED ORDER — KETOROLAC TROMETHAMINE 30 MG/ML IJ SOLN
30.0000 mg | Freq: Once | INTRAMUSCULAR | Status: AC
  Administered 2020-05-21: 30 mg via INTRAVENOUS

## 2020-05-21 MED ORDER — FENTANYL CITRATE (PF) 100 MCG/2ML IJ SOLN
INTRAMUSCULAR | Status: AC
  Filled 2020-05-21: qty 2

## 2020-05-21 MED ORDER — ACETAMINOPHEN 10 MG/ML IV SOLN: 1000.0000 mg | Freq: Once | INTRAVENOUS | Status: DC | PRN

## 2020-05-21 MED ORDER — MAGNESIUM SULFATE BOLUS VIA INFUSION
6.0000 g | Freq: Once | INTRAVENOUS | Status: AC
  Administered 2020-05-21: 6 g via INTRAVENOUS
  Filled 2020-05-21: qty 1000

## 2020-05-21 SURGICAL SUPPLY — 31 items
BENZOIN TINCTURE PRP APPL 2/3 (GAUZE/BANDAGES/DRESSINGS) ×2 IMPLANT
CLOSURE STERI STRIP 1/2 X4 (GAUZE/BANDAGES/DRESSINGS) ×2 IMPLANT
CLOTH BEACON ORANGE TIMEOUT ST (SAFETY) ×2 IMPLANT
DRSG OPSITE POSTOP 4X10 (GAUZE/BANDAGES/DRESSINGS) ×2 IMPLANT
ELECT REM PT RETURN 9FT ADLT (ELECTROSURGICAL) ×2 IMPLANT
ELECTRODE REM PT RTRN 9FT ADLT (ELECTROSURGICAL) ×1 IMPLANT
EXTRACTOR VACUUM KIWI (MISCELLANEOUS) IMPLANT
GLOVE BIOGEL PI IND STRL 7.0 (GLOVE) ×1 IMPLANT
GLOVE BIOGEL PI INDICATOR 7.0 (GLOVE) ×1
GLOVE SURG ORTHO 8.0 STRL STRW (GLOVE) ×2 IMPLANT
GOWN STRL REUS W/TWL LRG LVL3 (GOWN DISPOSABLE) ×4 IMPLANT
KIT ABG SYR 3ML LUER SLIP (SYRINGE) IMPLANT
NEEDLE HYPO 25X5/8 SAFETYGLIDE (NEEDLE) IMPLANT
NS IRRIG 1000ML POUR BTL (IV SOLUTION) ×2 IMPLANT
PACK C SECTION WH (CUSTOM PROCEDURE TRAY) ×2 IMPLANT
PAD OB MATERNITY 4.3X12.25 (PERSONAL CARE ITEMS) ×2 IMPLANT
PENCIL SMOKE EVAC W/HOLSTER (ELECTROSURGICAL) ×2 IMPLANT
RETAINER VISCERAL (MISCELLANEOUS) ×2 IMPLANT
RTRCTR C-SECT PINK 25CM LRG (MISCELLANEOUS) IMPLANT
STRIP CLOSURE SKIN 1/2X4 (GAUZE/BANDAGES/DRESSINGS) IMPLANT
SUT MON AB-0 CT1 36 (SUTURE) ×4 IMPLANT
SUT PLAIN 0 NONE (SUTURE) IMPLANT
SUT PLAIN 2 0 XLH (SUTURE) ×2 IMPLANT
SUT VIC AB 0 CT1 27 (SUTURE) ×2
SUT VIC AB 0 CT1 27XBRD ANBCTR (SUTURE) ×2 IMPLANT
SUT VIC AB 2-0 CT1 27 (SUTURE) ×1
SUT VIC AB 2-0 CT1 TAPERPNT 27 (SUTURE) ×1 IMPLANT
SUT VIC AB 4-0 KS 27 (SUTURE) ×2 IMPLANT
TOWEL OR 17X24 6PK STRL BLUE (TOWEL DISPOSABLE) ×2 IMPLANT
TRAY FOLEY W/BAG SLVR 14FR LF (SET/KITS/TRAYS/PACK) ×2 IMPLANT
WATER STERILE IRR 1000ML POUR (IV SOLUTION) ×2 IMPLANT

## 2020-05-21 NOTE — Op Note (Signed)
Operative Note   SURGERY DATE: 05/21/2020  PRE-OP DIAGNOSIS:  *Pregnancy at [redacted]w[redacted]d  * Di-Di Twins in Breech/Breech presentation  * Preterm Labor * Pyelonephritis   POST-OP DIAGNOSIS: Same    PROCEDURE: primary low transverse cesarean section via pfannenstiel skin incision with double layer uterine closure  SURGEON: Surgeon(s) and Role:    Warden Fillers, MD - Primary    * Chandni Gagan, Arlana Pouch, MD - Fellow   ANESTHESIA: spinal  ESTIMATED BLOOD LOSS: 437  DRAINS: UOP via indwelling foley  TOTAL IV FLUIDS: 1791 mL crystalloid  VTE PROPHYLAXIS: SCDs to bilateral lower extremities  ANTIBIOTICS: Two grams of Cefazolin were given., within 1 hour of skin incision  SPECIMENS: placentas to pathology   COMPLICATIONS: none  INDICATIONS: patient progressed into preterm labor and made cervical change to 8 cm, Twin A confirmed to be in breech presentation   FINDINGS: No intra-abdominal adhesions were noted. Grossly normal uterus, tubes and ovaries. clear amniotic fluid, breech female infant, weight pending, APGARs per NICU; intact placenta, breech female infant, weight pending, APGARs per NICU; intact placenta (cord clamps on Twin B placenta).  PROCEDURE IN DETAIL: The patient was taken to the operating room where anesthesia was administered and normal fetal heart tones were confirmed. She was then prepped and draped in the normal fashion in the dorsal supine position with a leftward tilt.  After a time out was performed, a pfannensteil skin incision was made with the scalpel and carried through to the underlying layer of fascia. The fascia was then incised at the midline and this incision was extended laterally with the mayo scissors. Attention was turned to the superior aspect of the fascial incision which was grasped with the kocher clamps x 2, tented up and the rectus muscles were dissected off with the mayo scissors. In a similar fashion the inferior aspect of the fascial incision was  grasped with the kocher clamps, tented up and the rectus muscles dissected off with the mayo scissors. The rectus muscles were then separated in the midline and the peritoneum was entered bluntly. The alexis retractor was inserted and the vesicouterine peritoneum was identified, tented up and entered with the metzenbaum scissors. This incision was extended laterally and the bladder flap was created digitally.   A low transverse hysterotomy was made with the scalpel until the endometrial cavity was breached and the amniotic sac ruptured with the Allis clamp, yielding clear amniotic fluid. This incision was extended bluntly and Twin A's head, shoulders and body were delivered atraumatically from breech position.The cord was clamped x 2 and cut, and the infant was handed to the awaiting pediatricians, after delayed cord clamping was done. Twin B amniotic sac subsequently ruptured with allis clamp, yielding clear fluid. Twin B delivered atraumatically from breech position. Cord was clamped x2 and cut after one minute. Infant handed off to pediatrician.  The placentas were then gradually expressed from the uterus and then the uterus was exteriorized and cleared of all clots and debris. The hysterotomy was repaired with a running suture of 1-0 monocryl. A second imbricating layer of 1-0 monocryl suture was then placed.     The uterus and adnexa were then returned to the abdomen, and the hysterotomy and all operative sites were reinspected and excellent hemostasis was noted after irrigation and suction of the abdomen with warm saline.  The fascia was reapproximated with 0 Vicryl in a simple running fashion bilaterally. The subcutaneous layer was then reapproximated with running sutures of 2-0 plain  gut, and the skin was then closed with 4-0 vicryl, in a subcuticular fashion.  The patient  tolerated the procedure well. Sponge, lap, needle, and instrument counts were correct x 2. The patient was transferred to the  recovery room awake, alert and breathing independently in stable condition.   Casper Harrison, MD [facility] Fellow, [facility] for [facility], [facility]

## 2020-05-21 NOTE — Progress Notes (Signed)
    Faculty Practice OB/GYN Attending Note  Subjective:  Called to evaluate patient with bleeding. Noticed red bleeding and small clots when she went to bathroom. On evaluation, patient showed the blood she noted. About 50 ml of blood and clots noted.  Still reports some lower back and lower abdominal pain. No LOF. Good FM x 2.   Admitted on 05/19/2020 for Pyelonephritis affecting pregnancy in third trimester.    Objective:  Blood pressure 132/82, pulse (!) 104, temperature 98.3 F (36.8 C), temperature source Oral, resp. rate 18, height 5\' 4"  (1.626 m), weight 121.2 kg, last menstrual period 10/20/2019, SpO2 96 %. FHT  Baseline 130 bpm x 2, moderate variability x 2, +accelerations x 2, no decelerations x 2 Toco: flat Gen: Worried HENT: Normocephalic, atraumatic Lungs: Normal respiratory effort Heart: Regular rate noted Abdomen: NT, gravid fundus, soft, mild TTP in lower abdomen Ext: 2+ DTRs, no edema, no cyanosis, negative Homan's sign SSE: Red bloody mucus discharge noted, small amount of blood in vault. No active bleeding noted.  Cervix visually closed and long. Pelvic cultures checked (wet prep, GC/Chlam, GBS).  Labs: CBC Latest Ref Rng & Units 05/21/2020 05/20/2020 05/19/2020  WBC 4.0 - 10.5 K/uL 20.1(H) 24.6(H) 16.8(H)  Hemoglobin 12.0 - 15.0 g/dL 07/19/2020) 4.7(W) 10.1(L)  Hematocrit 36.0 - 46.0 % 27.1(L) 31.2(L) 31.6(L)  Platelets 150 - 400 K/uL 200 219 247   CMP Latest Ref Rng & Units 05/21/2020 05/20/2020 05/19/2020  Glucose 70 - 99 mg/dL 92 96 07/19/2020)  BUN 6 - 20 mg/dL 621(H) 6 5(L)  Creatinine 0.44 - 1.00 mg/dL <0(Q 6.57 8.46  Sodium 135 - 145 mmol/L 133(L) 134(L) 134(L)  Potassium 3.5 - 5.1 mmol/L 3.7 4.1 3.5  Chloride 98 - 111 mmol/L 103 101 104  CO2 22 - 32 mmol/L 18(L) 17(L) 19(L)  Calcium 8.9 - 10.3 mg/dL 8.9 8.9 9.62)  Total Protein 6.5 - 8.1 g/dL 5.2(L) 6.1(L) 6.3(L)  Total Bilirubin 0.3 - 1.2 mg/dL 0.5 1.0 1.2  Alkaline Phos 38 - 126 U/L 103 98 109  AST 15 - 41 U/L 18 25 19    ALT 0 - 44 U/L 13 15 14     Assessment & Plan:  25 y.o. G1P0 with di-di twins at [redacted]w[redacted]d admitted for pyelonephritis, now with vaginal bleeding.  Too much to be secondary after gentle cervical check three hours ago. Also hemoglobin has decreased from 10.1 to 8.6 (?dilution effect). Given signs and symptoms, concerned about possible abruption.   - NPO for now, will recheck labs - MFM limited ultrasound ordered to evaluate placentas and fetuses - Betamethasone regimen started. Will hold off on magnesium sulfate for now, but consider it if bleeding continues - Neonatology consulted (talked to Dr. ), also informed NICU RN in charge of patient's change in status - Category 1 FHR tracing x 2 for now, will do continuous FHR monitoring and tocometry for now. - Continue Rocephin therapy for pyelonephritis - Continue close observation.   25, MD, FACOG Obstetrician & Gynecologist, [facility] for [facility], [facility]

## 2020-05-21 NOTE — Consult Note (Signed)
Neonatology Consult to Antenatal Patient: 05/21/2020 11:59 AM    I was requested by Dr Macon Large to see this patient in order to provide antenatal counseling due to vaginal bleeding at 30 4/7 weeks Di-Di Twin gestation.    Cathy Roman is a 25 y/o G1P0 who was admitted for pyelonephritis, now with vaginal bleeding and is now 30 4/[redacted] weeks GA.   She is currently "not" having active labor.  She is getting her first dose of BMZ today and on Rocephin for pyelonephritis.   I spoke with Cathy Roman, FOB and MGM in Room 109.   We discussed in detail what to expect in case of possible delivery of the tinws in the next few days including morbidity and mortality at this gestational age, usual delivery room resuscitation including intubation and surfactant administration in the DR.  Discussed possible respiratory complications and need for support including mechanical ventilation, IV access, sepsis work-up, NG/OG feedings ( benefits of BF and availability of DBM, which was encouraged), risk for IVH with the potential for motor/cognitive deficits, length of stay and long-term outcome.  They were attentive, had appropriate questions, and expressed appreciation for my input.     Thank you for asking [place] to see this patient and allowing [place] to participate in her care.  Please call if there are any further questions.   Overton Mam, MD (Attending Neonatologist)  Total length of face-to-face or floor/unit time for this encounter was 40 minutes. Counseling and/or coordination of care was greater than fifty percent of the time above.

## 2020-05-21 NOTE — Progress Notes (Addendum)
Daily Antepartum Note  Admission Date: 05/19/2020 Current Date: 05/21/2020 8:21 AM  Cathy Roman is a 25 y.o. G1P0 @ [redacted]w[redacted]d, HD#3, admitted for pyelonephritis (back pain, fevers, chills, dirty urine).  Pregnancy complicated by: Patient Active Problem List   Diagnosis Date Noted  . BMI 40.0-44.9, adult (HCC) 05/20/2020  . Obesity in pregnancy 05/20/2020  . UTI in pregnancy 05/19/2020  . Pyelonephritis affecting pregnancy in third trimester 05/19/2020  . Elevated blood pressure affecting pregnancy in third trimester, antepartum 05/19/2020  . Dichorionic diamniotic twin gestation 05/19/2020  . Major depressive disorder, single episode, severe without psychotic features (HCC) 02/28/2014  . MDD (major depressive disorder), single episode, severe , no psychosis (HCC) 02/28/2014    Overnight/24hr events:  Had fever to 101.2 on 5/2 2314.  Urine culture positive for GNR, Blood culture positive for E.coli and Enterobacterales. Reviewed by Pharmacy, plan is is to continue Rocephin.  Subjective:  Patient reports abdominal cramping, low back pain that radiates down legs.  No LOF, no VB.  Good FM x 2. Reports constipation.  Objective:     Current Vital Signs 24h Vital Sign Ranges  T 98.3 F (36.8 C) Temp  Avg: 99.4 F (37.4 C)  Min: 97.8 F (36.6 C)  Max: 101.2 F (38.4 C)  BP 132/82 BP  Min: 117/66  Max: 133/69  HR (!) 104 Pulse  Avg: 117.4  Min: 97  Max: 139  RR 18 Resp  Avg: 19.8  Min: 18  Max: 24  SaO2 96 %  (Room air) SpO2  Avg: 94.5 %  Min: 93 %  Max: 100 %       24 Hour I/O Current Shift I/O  Time Ins Outs 05/02 0701 - 05/03 0700 In: 2525.8 [P.O.:960; I.V.:1565.8] Out: 2850 [Urine:2850] No intake/output data recorded.   Patient Vitals for the past 24 hrs:  BP Temp Temp src Pulse Resp SpO2  05/21/20 0740 132/82 98.3 F (36.8 C) Oral (!) 104 18 96 %  05/21/20 0557 130/81 97.8 F (36.6 C) Oral 97 19 100 %  05/21/20 0100 -- 99.8 F (37.7 C) Oral -- -- --  05/20/20 2314 --  (!) 101.2 F (38.4 C) Oral -- -- --  05/20/20 2230 -- (!) 100.4 F (38 C) Oral -- -- --  05/20/20 2130 -- 99.4 F (37.4 C) Oral -- -- --  05/20/20 1920 133/69 98.5 F (36.9 C) Oral (!) 123 18 94 %  05/20/20 1701 -- -- -- -- -- 93 %  05/20/20 1656 -- -- -- -- -- 93 %  05/20/20 1651 -- -- -- -- -- 94 %  05/20/20 1646 -- -- -- -- -- 94 %  05/20/20 1641 -- -- -- -- -- 94 %  05/20/20 1636 -- -- -- -- -- 95 %  05/20/20 1631 -- -- -- -- -- 95 %  05/20/20 1528 117/66 98.9 F (37.2 C) Oral (!) 124 20 94 %  05/20/20 1141 (!) 132/56 100.3 F (37.9 C) Oral (!) 139 (!) 24 94 %  05/20/20 0911 -- -- -- -- -- 97 %  05/20/20 0906 -- -- -- -- -- 94 %  05/20/20 0901 -- -- -- -- -- 94 %  05/20/20 0856 -- -- -- -- -- 94 %  05/20/20 0851 -- -- -- -- -- 93 %  05/20/20 0846 -- -- -- -- -- 94 %  05/20/20 0841 -- -- -- -- -- 94 %  05/20/20 0831 -- -- -- -- -- 94 %  05/20/20 0826 -- -- -- -- --  95 %   UOP:>151mL/hr  FHT A 150 baseline, + accel, no decel, mod variability B 140 baseline, + accel, no decel, mod varability Tocometry irritability  Physical exam: General: Well nourished, well developed female in no acute distress.  Abdomen: gravid, obese, no TTP Cardiovascular: normal rate Respiratory: no respiratory distress, normal breath sounds Extremities: no clubbing, cyanosis or edema Skin: Warm and dry.  Cervix: Dilation: 1 Effacement (%): Thick Cervical Position: Posterior Station: Ballotable Presentation: Undeterminable Exam by:: Dr. Macon Large  Medications: Current Facility-Administered Medications  Medication Dose Route Frequency Provider Last Rate Last Admin  . acetaminophen (TYLENOL) tablet 650 mg  650 mg Oral Q4H PRN Myna Hidalgo, DO   650 mg at 05/21/20 0758  . calcium carbonate (TUMS - dosed in mg elemental calcium) chewable tablet 400 mg of elemental calcium  2 tablet Oral Q4H PRN Leftwich-Kirby, Wilmer Floor, CNM      . cefTRIAXone (ROCEPHIN) 2 g in sodium chloride 0.9 % 100 mL IVPB   2 g Intravenous Q24H Leftwich-Kirby, Lisa A, CNM 200 mL/hr at 05/20/20 2106 2 g at 05/20/20 2106  . diphenhydrAMINE (BENADRYL) capsule 50 mg  50 mg Oral QHS PRN Myna Hidalgo, DO   50 mg at 05/21/20 0211  . docusate sodium (COLACE) capsule 100 mg  100 mg Oral Daily Leftwich-Kirby, Lisa A, CNM      . escitalopram (LEXAPRO) tablet 10 mg  10 mg Oral Daily Misha Vanoverbeke A, MD   10 mg at 05/20/20 1254  . labetalol (NORMODYNE) injection 20 mg  20 mg Intravenous PRN Leftwich-Kirby, Lisa A, CNM       And  . labetalol (NORMODYNE) injection 40 mg  40 mg Intravenous PRN Leftwich-Kirby, Lisa A, CNM       And  . labetalol (NORMODYNE) injection 80 mg  80 mg Intravenous PRN Leftwich-Kirby, Lisa A, CNM       And  . hydrALAZINE (APRESOLINE) injection 10 mg  10 mg Intravenous PRN Leftwich-Kirby, Wilmer Floor, CNM      . HYDROmorphone (DILAUDID) injection 1 mg  1 mg Intravenous Q2H PRN [place] Bing, MD   1 mg at 05/21/20 0753  . HYDROmorphone (DILAUDID) tablet 2-4 mg  2-4 mg Oral Q6H PRN [place] Bing, MD   4 mg at 05/21/20 0526  . lactated ringers infusion   Intravenous Continuous Myna Hidalgo, DO 125 mL/hr at 05/20/20 2308 Rate Change at 05/20/20 2308  . ondansetron (ZOFRAN) injection 4 mg  4 mg Intravenous Q6H PRN Ozan, Jennifer, DO      . polyethylene glycol (MIRALAX / GLYCOLAX) packet 17 g  17 g Oral Daily PRN Deaundra Dupriest A, MD      . prenatal multivitamin tablet 1 tablet  1 tablet Oral Q1200 Leftwich-Kirby, Lisa A, CNM   1 tablet at 05/20/20 1211  . senna-docusate (Senokot-S) tablet 1-2 tablet  1-2 tablet Oral BID PRN Thiago Ragsdale, Jethro Bastos, MD        Labs:  Pending: BCx x 2, UCx  Recent Labs  Lab 05/19/20 1831 05/20/20 0733 05/21/20 0427  WBC 16.8* 24.6* 20.1*  HGB 10.1* 9.8* 8.6*  HCT 31.6* 31.2* 27.1*  PLT 247 219 200    Recent Labs  Lab 05/19/20 1831 05/20/20 0733 05/21/20 0427  NA 134* 134* 133*  K 3.5 4.1 3.7  CL 104 101 103  CO2 19* 17* 18*  BUN 5* 6 <5*  CREATININE 0.64  0.82 0.66  CALCIUM 8.7* 8.9 8.9  PROT 6.3* 6.1* 5.2*  BILITOT 1.2 1.0 0.5  ALKPHOS 109 98 103  ALT 14 15 13   AST 19 25 18   GLUCOSE 108* 96 92     Radiology:  No new imaging  Assessment & Plan:  Patient improving *Pregnancy: Category I  X 2 . No PTL.  Continue q shift NST *Pyelo: continue Rocephin, aggressive IVF hydration. .  *?gHTN: BPs are normalizing with improvement of infection. Continue to follow.  *Preterm: no current issues.  *PPx: lovenox, oob ad lib *FEN/GI: regular diet, MIVF. Medications given prn constipation. *Dispo: At least 48-72 hours afebrile  , MD Center for [facility]) GYN Consult Phone: [phone] (M-F, 0800-1700) & [phone] (Off hours, weekends, holidays)

## 2020-05-21 NOTE — Anesthesia Preprocedure Evaluation (Addendum)
Anesthesia Evaluation  Patient identified by MRN, date of birth, ID band Patient awake    Reviewed: Allergy & Precautions, NPO status , Patient's Chart, lab work & pertinent test results  Airway Mallampati: I  TM Distance: >3 FB Neck ROM: Full    Dental no notable dental hx.    Pulmonary former smoker,    Pulmonary exam normal breath sounds clear to auscultation       Cardiovascular negative cardio ROS Normal cardiovascular exam Rhythm:Regular Rate:Normal     Neuro/Psych  Headaches, PSYCHIATRIC DISORDERS Anxiety Depression    GI/Hepatic negative GI ROS, Neg liver ROS,   Endo/Other  Morbid obesity  Renal/GU negative Renal ROS     Musculoskeletal negative musculoskeletal ROS (+)   Abdominal (+) + obese,   Peds  (+) ATTENTION DEFICIT DISORDER WITHOUT HYPERACTIVITY and ADHD Hematology  (+) anemia ,   Anesthesia Other Findings 30 week TW malpresentation  Reproductive/Obstetrics                            Anesthesia Physical Anesthesia Plan  ASA: III  Anesthesia Plan: Spinal   Post-op Pain Management:    Induction: Intravenous  PONV Risk Score and Plan: 2 and Ondansetron, Dexamethasone and Treatment may vary due to age or medical condition  Airway Management Planned: Natural Airway  Additional Equipment:   Intra-op Plan:   Post-operative Plan:   Informed Consent: I have reviewed the patients History and Physical, chart, labs and discussed the procedure including the risks, benefits and alternatives for the proposed anesthesia with the patient or authorized representative who has indicated his/her understanding and acceptance.     Dental advisory given  Plan Discussed with: CRNA and Surgeon  Anesthesia Plan Comments:         Anesthesia Quick Evaluation

## 2020-05-21 NOTE — Discharge Summary (Signed)
Postpartum Discharge Summary     Patient Name: Cathy Roman DOB: October 05, 1995 MRN: [id number]  Date of admission: 05/19/2020 Delivery date:   Shaleena, Crusoe [[id number]]  05/21/2020    Jakiah, Bienaime [[id number]]  05/21/2020   Delivering provider:    Leliana, Kontz [[id number]]  Margarie, Mcguirt [[id number]]  Mariel Aloe A   Date of discharge: 05/24/2020  Admitting diagnosis: Cesarean delivery delivered [O82] Intrauterine pregnancy: [redacted]w[redacted]d     Secondary diagnosis:  Principal Problem:   Pyelonephritis affecting pregnancy in third trimester Active Problems:   UTI in pregnancy   Dichorionic diamniotic twin pregnancy in third trimester   Obesity in pregnancy   [redacted] weeks gestation of pregnancy   E coli bacteremia   Cesarean delivery delivered  Additional problems: None    Discharge diagnosis: Preterm Pregnancy Delivered                                              Post partum procedures:None Augmentation: N/A Complications: None  Hospital course: Onset of Labor With Unplanned C/S   25 y.o. yo G1P0102 at [redacted]w[redacted]d was admitted for acute pyelonephritis on 05/19/2020. She was started on ceftriaxone 2g and monitored in hospital. On 5/3 patient began to experience painful contractions, found to be dilated. She was started on magnesium and received betamethasone. She continued to progress and given Breech presentation of Twin A, was taken for primary cesarean section. Delivery details as follows: Membrane Rupture Time/Date:    Ercie, Eliasen [[id number]]  6:01 PM    Kiehn, Erick Blinks [[id number]]  6:03 PM  ,   Myasia, Sinatra [[id number]]  05/21/2020    Isadora, Delorey [[id number]]  05/21/2020    Delivery Method:   Donald Pore [[id number]]  C-Section, Low Transverse    Kahliya, Fraleigh [[id number]]  C-Section, Low Transverse   Details of operation can be found in separate operative note. Patient had an  uncomplicated postpartum course.  She is ambulating,tolerating a regular diet, passing flatus, and urinating well. No further fevers, was switched to oral antibiotics to complete regimen for pyelonephritis.  Patient is discharged home in stable condition 05/24/20.  Newborn Data: Birth date:   Mayola, Mcbain [[id number]]  05/21/2020    Geneviene, Tesch [[id number]]  05/21/2020   Birth time:   Jaslynne, Dahan [[id number]]  6:01 PM    Caspers, Erick Blinks [[id number]]  6:03 PM   Gender:   Lakyia, Behe [[id number]]  Female    Morejon, SARAYU PREVOST [[id number]]  Female   Living status:   Sabrin, Dunlevy [[id number]]  Living    RoddenZnya, Albino [[id number]]  Living   Apgars:   Reighlynn, Swiney [[id number]]  7    Pelley, HAIZLEY CANNELLA [[id number]]  6  ,   Lynzee, Lindquist [[id number]]  6    Verba, Ainley [[id number]]  9   Weight:   Weslie, Rasmus [[id number]]  1620 g    Darya, Bigler [[id number]]  1670 g    Magnesium Sulfate received: Yes: Neuroprotection BMZ received: Yes  Physical exam  Blood pressure 125/84, pulse 92, temperature 97.9 F (36.6 C), temperature source Oral, resp. rate 18, height 5\' 4"  (1.626 m), weight 121.2 kg, last menstrual period 10/20/2019, SpO2 98 %, unknown if currently breastfeeding.  General: alert  and cooperative Lochia: appropriate Uterine Fundus: firm Incision: Dressing is clean, dry, and intact DVT Evaluation: No evidence of DVT seen on physical exam. Negative Homan's sign. No cords or calf tenderness. Labs: Lab Results  Component Value Date   WBC 16.2 (H) 05/22/2020   HGB 8.4 (L) 05/22/2020   HCT 26.7 (L) 05/22/2020   MCV 84.2 05/22/2020   PLT 243 05/22/2020   CMP Latest Ref Rng & Units 05/21/2020  Glucose 70 - 99 mg/dL 92  BUN 6 - 20 mg/dL <0(Q)  Creatinine 6.76 - 1.00 mg/dL 1.95  Sodium 093 - 267 mmol/L 133(L)  Potassium 3.5 - 5.1 mmol/L 3.7  Chloride 98 - 111 mmol/L 103   CO2 22 - 32 mmol/L 18(L)  Calcium 8.9 - 10.3 mg/dL 8.9  Total Protein 6.5 - 8.1 g/dL 5.2(L)  Total Bilirubin 0.3 - 1.2 mg/dL 0.5  Alkaline Phos 38 - 126 U/L 103  AST 15 - 41 U/L 18  ALT 0 - 44 U/L 13   Edinburgh Score: Edinburgh Postnatal Depression Scale Screening Tool 05/23/2020  I have been able to laugh and see the funny side of things. 0  I have looked forward with enjoyment to things. 0  I have blamed myself unnecessarily when things went wrong. 0  I have been anxious or worried for no good reason. 0  I have felt scared or panicky for no good reason. 0  Things have been getting on top of me. 0  I have been so unhappy that I have had difficulty sleeping. 0  I have felt sad or miserable. 0  I have been so unhappy that I have been crying. 0  The thought of harming myself has occurred to me. 0  Edinburgh Postnatal Depression Scale Total 0     After visit meds:  Allergies as of 05/24/2020      Reactions   Sulfa Antibiotics Hives      Medication List    TAKE these medications   acetaminophen 500 MG tablet Commonly known as: TYLENOL Take 500 mg by mouth every 6 (six) hours as needed for mild pain.   cefadroxil 1 g tablet Commonly known as: DURICEF Take 1 tablet (1 g total) by mouth 2 (two) times daily for 6 days. To complete on 5/11.   docusate sodium 100 MG capsule Commonly known as: COLACE Take 1 capsule (100 mg total) by mouth 2 (two) times daily as needed for mild constipation or moderate constipation.   escitalopram 10 MG tablet Commonly known as: LEXAPRO Take 10 mg by mouth daily.   ibuprofen 600 MG tablet Commonly known as: ADVIL Take 1 tablet (600 mg total) by mouth every 6 (six) hours as needed for headache, mild pain, moderate pain or cramping.   multivitamin-prenatal 27-0.8 MG Tabs tablet Take 1 tablet by mouth daily at 12 noon.   oxyCODONE 5 MG immediate release tablet Commonly known as: Oxy IR/ROXICODONE Take 1 tablet (5 mg total) by mouth every 6  (six) hours as needed for moderate pain, severe pain or breakthrough pain.            Discharge Care Instructions  (From admission, onward)         Start     Ordered   05/24/20 0000  Discharge wound care:       Comments: As per discharge handout and nursing instructions   05/24/20 1045           Discharge home in stable condition Infant Feeding: Breast Infant  Disposition:NICU Discharge instruction: per After Visit Summary and Postpartum booklet. Activity: Advance as tolerated. Pelvic rest for 6 weeks.  Diet: routine diet Future Appointments: Future Appointments  Date Time Provider Department Center  05/28/2020  2:30 PM [facility] NURSE [facility]  06/18/2020  2:15 PM Marsala, Arlana Pouch, MD [facility]   Follow up Visit:  Follow-up Information    [facility] Provider. Schedule an appointment as soon as possible for a visit in 2 week(s).   Why: Incision Check              Please schedule this patient for a In person postpartum visit in 4 weeks with the following provider: MD. Additional Postpartum F/U:Incision check 1 week and BP check 1 week  High risk pregnancy complicated by: pyelonephritis, preterm delivery Delivery mode:     Jackqulyn, Mendel [[id number]]  C-Section, Low Transverse    Talayia, Hjort [[id number]]  C-Section, Low Transverse   Anticipated Birth Control:  Unsure    05/24/2020 Jaynie Collins, MD

## 2020-05-21 NOTE — Progress Notes (Signed)
    Faculty Practice OB/GYN Attending Note Patient reports increased pelvic pressure and back pain.  Already on magnesium sulfate and received one dose of betamethasone.  Blood pressure 124/77, pulse 98, temperature 97.6 F (36.4 C), temperature source Oral, resp. rate 18, height 5\' 4"  (1.626 m), weight 121.2 kg, last menstrual period 10/20/2019, SpO2 98 %. Category 1 FHR tracing x 2 Tocometer no obvious contractions seen, but patient is having contractions visibly every 3-4 minutes Dilation: 4 Effacement (%): 90 Cervical Position: Middle Station: -2 Presentation: Complete Breech Exam by:: Dr. [email or web address] Bloody show noted.  Increased magnesium sulfate to 2.5g/hr, Procardia 10 mg po x 1 also given. Given change in cervical exam and concern for PTL with fetal malpresentation, patient counseled about possible need for cesarean delivery.  The risks of surgery were discussed with the patient including but were not limited to: bleeding which may require transfusion or reoperation; infection which may require antibiotics; injury to bowel, bladder, ureters or other surrounding organs; injury to the fetus; need for additional procedures including hysterectomy in the event of a life-threatening hemorrhage; formation of adhesions; placental abnormalities with subsequent pregnancies; incisional problems; thromboembolic phenomenon and other postoperative/anesthesia complications.  The patient concurred with the proposed plan, giving informed written consent for the procedure.   Patient has been NPO since 1245. NICU, Anesthesia and OR aware.   Continue close monitoring. Will recheck if pressure increases or for any concerns, and may need to go to OR.    Macon Large, MD, FACOG Obstetrician & Gynecologist, [facility] for [facility]

## 2020-05-21 NOTE — Transfer of Care (Signed)
Immediate Anesthesia Transfer of Care Note  Patient: Cathy Roman  Procedure(s) Performed: CESAREAN SECTION (Abdomen)  Patient Location: PACU  Anesthesia Type:Spinal  Level of Consciousness: awake, alert  and oriented  Airway & Oxygen Therapy: Patient Spontanous Breathing  Post-op Assessment: Report given to RN and Post -op Vital signs reviewed and stable  Post vital signs: Reviewed and stable  Last Vitals:  Vitals Value Taken Time  BP 113/56   Temp    Pulse 104   Resp 17   SpO2 94     Last Pain:  Vitals:   05/21/20 1700  TempSrc: Oral  PainSc:       Patients Stated Pain Goal: 4 (05/21/20 1500)  Complications: No complications documented.

## 2020-05-21 NOTE — Progress Notes (Addendum)
    Faculty Practice OB/GYN Attending Note Patient reports pain in her back that comes every three minutes, moderate intensity.  No further bleeding noted, no LOF.  Good FM x 2.  Category 1 FHR tracing x 2 Tocometer no obvious contractions seen Still awaiting ultrasound.  Will start magnesium sulfate for neuroprotection now, this may also help with pain. Analgesia given as needed, will also give Flexeril as needed. Continue NPO for now. Continue close monitoring.   Jaynie Collins, MD, FACOG Obstetrician & Gynecologist, [facility] for [employer]

## 2020-05-21 NOTE — Progress Notes (Signed)
    Faculty Practice OB/GYN Attending Note Patient reports increased pelvic pressure and pushing involuntarily.  Blood pressure 124/77, pulse 98, temperature 97.6 F (36.4 C), temperature source Oral, resp. rate 18, height 5\' 4"  (1.626 m), weight 121.2 kg, last menstrual period 10/20/2019, SpO2 98 %. Category 1 FHR tracing x 2 Dilation: 7 Effacement (%): Thick Cervical Position: Middle Station: -2 Presentation: Complete Breech Exam by:: Dr. [email or web address] Bloody show noted.  To OR now for urgent cesarean section for active preterm labor. OR notified. Dr. Macon Large to perform surgery. Report given to him.   Donavan Foil, MD, FACOG Obstetrician & Gynecologist, [facility] for [facility]

## 2020-05-21 NOTE — Lactation Note (Signed)
This note was copied from a baby's chart. Lactation Consultation Note  Patient Name: SUNDEE GARLAND Guagliardo ULAGT'X Date: 05/21/2020  Age: 25 hours  P3, Preterm Multiples in NICU. LC attempted to see mom in [facility] Care mom was asleep when [facility] entered the room. LC talked with RN on OB Speciality Care and RN will set up and assist mom with using the DEBP when she wakes , RN will  explained to mom to pump every 3 hours for 15 minutes on initial setting.  [facility] services will follow with mom in morning.   Maternal Data    Feeding    LATCH Score                    Lactation Tools Discussed/Used    Interventions    Discharge    Consult Status      Danelle Earthly 05/21/2020, 11:52 PM

## 2020-05-21 NOTE — Anesthesia Postprocedure Evaluation (Signed)
Anesthesia Post Note  Patient: Cathy Roman  Procedure(s) Performed: CESAREAN SECTION (Abdomen)     Patient location during evaluation: PACU Anesthesia Type: Spinal Level of consciousness: oriented and awake and alert Pain management: pain level controlled Vital Signs Assessment: post-procedure vital signs reviewed and stable Respiratory status: spontaneous breathing, respiratory function stable and nonlabored ventilation Cardiovascular status: blood pressure returned to baseline and stable Postop Assessment: no headache, no backache, no apparent nausea or vomiting and spinal receding Anesthetic complications: no   No complications documented.  Last Vitals:  Vitals:   05/21/20 1945 05/21/20 2000  BP: (!) 99/53 114/66  Pulse: (!) 103 (!) 103  Resp: 14 18  Temp:    SpO2: 94% 95%    Last Pain:  Vitals:   05/21/20 1930  TempSrc: Oral  PainSc: 6    Pain Goal: Patients Stated Pain Goal: 4 (05/21/20 1500)  LLE Motor Response: Purposeful movement (05/21/20 2000) LLE Sensation: Full sensation (05/21/20 2000) RLE Motor Response: Purposeful movement (05/21/20 2000) RLE Sensation: Full sensation (05/21/20 2000)     Epidural/Spinal Function Cutaneous sensation: Normal sensation (05/21/20 2000), Patient able to flex knees: Yes (05/21/20 2000), Patient able to lift hips off bed: No (05/21/20 2000), Back pain beyond tenderness at insertion site: No (05/21/20 2000), Progressively worsening motor and/or sensory loss: No (05/21/20 2000), Bowel and/or bladder incontinence post epidural: No (05/21/20 2000)  [name]

## 2020-05-21 NOTE — Anesthesia Procedure Notes (Signed)
Spinal  Patient location during procedure: OR Start time: 05/21/2020 5:40 PM End time: 05/21/2020 5:45 PM Reason for block: surgical anesthesia Staffing Performed: anesthesiologist  Anesthesiologist: Leonides Grills, MD Preanesthetic Checklist Completed: patient identified, IV checked, risks and benefits discussed, surgical consent, monitors and equipment checked, pre-op evaluation and timeout performed Spinal Block Patient position: sitting Prep: DuraPrep Patient monitoring: cardiac monitor, continuous pulse ox and blood pressure Approach: midline Location: L4-5 Injection technique: single-shot Needle Needle type: Pencan  Needle gauge: 24 G Needle length: 9 cm Assessment Sensory level: T10 Events: CSF return Additional Notes Functioning IV was confirmed and monitors were applied. Sterile prep and drape, including hand hygiene and sterile gloves were used. The patient was positioned and the spine was prepped. The skin was anesthetized with lidocaine.  Free flow of clear CSF was obtained prior to injecting local anesthetic into the CSF.  The spinal needle aspirated freely following injection.  The needle was carefully withdrawn.  The patient tolerated the procedure well.

## 2020-05-21 NOTE — Progress Notes (Signed)
Called to Patient's room and shown a picture of toilet tissue with vaginal bleeding on it with a small clott. Dr. Macon Large notified and en route to patient's room. Carmelina Dane, RN

## 2020-05-21 NOTE — Consult Note (Signed)
[facility]    Date of Admission:  05/19/2020     Reason for Consult: 3rd trimester pregnancy and pyelonephritis/ecoli bsi    Referring Provider: Jaynie Collins   Lines:  Peripheral  Abx: 5/1-c ceftriaxone        Assessment: 25 yo female g1p0 [redacted]w[redacted]d pregnancy admitted 5/01 with sepsis in setting ecoli pyelo and bsi  Her course is complicated today 5/03 by passing vaginal blood clot. Ob team aware. So far fetal monitoring is reassuring  Her sepis is improving in terms of leukocytosis. She still have fever within the past 24 hours  Both urine and bcx growing ecoli, pending susceptibility   Renal u/s showing bilateral hydronephrosis  Plan: 1. Continue ceftriaxone 2. Plan 10 day of this and going to prophylacic abx for the duration of pregnancy  3.   We'll follow up final cultures susceptibility and adjust abx/recs accordingly    ------------------------------------------------ Principal Problem:   Pyelonephritis affecting pregnancy in third trimester Active Problems:   UTI in pregnancy   Dichorionic diamniotic twin pregnancy in third trimester   Obesity in pregnancy   [redacted] weeks gestation of pregnancy    HPI: Cathy Roman is a 25 y.o. female g1p0 at [redacted]w[redacted]d pregnancy admitted with pyelo/sepsis found to have ecoli bsi  She developed acute onset of left back pain and fever 2 days prior to admission. Sx progressive. The pain radiate/involves the anterior abdomen as time passes.   Pain is severe.   No cough/n/v/diarrhea/headache, joint pain, rash  No dysuria/urinary frequency/urgency  No vaginal bleeding/discharge prior to admission  Course: Febrile Wbc 16 but increased to peak 25 cxr clear Renal u/s hydronephrosis UA with >50 wbc, 6-10 rbc bcx ultimately grew ecoli bs abx ceftriaxone started ucx also growing gnr  Her wbc improving On hd#2 passed clot vaginally. Fetal heart monitoring ok. No sign of labor  otherwise    Family History  Problem Relation Age of Onset  . Mental illness Neg Hx     Social History   Tobacco Use  . Smoking status: Former Games developer  . Smokeless tobacco: Never Used  Vaping Use  . Vaping Use: Never used  Substance Use Topics  . Alcohol use: No  . Drug use: No    Allergies  Allergen Reactions  . Sulfa Antibiotics Hives    Review of Systems: ROS All Other ROS was negative, except mentioned above   Past Medical History:  Diagnosis Date  . ADHD    ADD  . Anxiety disorder   . Chronic migraine without aura, intractable, without status migrainosus   . Depression   . Former cigarette smoker   . Psychiatric disorder        Scheduled Meds: . betamethasone acetate-betamethasone sodium phosphate  12 mg Intramuscular Q24 Hr x 2  . docusate sodium  100 mg Oral Daily  . escitalopram  10 mg Oral Daily  . prenatal multivitamin  1 tablet Oral Q1200   Continuous Infusions: . cefTRIAXone (ROCEPHIN)  IV 2 g (05/20/20 2106)  . lactated ringers 125 mL/hr at 05/20/20 2308   PRN Meds:.acetaminophen, calcium carbonate, diphenhydrAMINE, labetalol **AND** labetalol **AND** labetalol **AND** hydrALAZINE **AND** Measure blood pressure, HYDROmorphone (DILAUDID) injection, HYDROmorphone, ondansetron (ZOFRAN) IV, polyethylene glycol, senna-docusate   OBJECTIVE: Blood pressure 132/82, pulse (!) 104, temperature 98.3 F (36.8 C), temperature source Oral, resp. rate 18, height 5\' 4"  (1.626 m), weight 121.2 kg, last menstrual period 10/20/2019, SpO2 96 %.  Physical  Exam Constitutional:      General: She is in acute distress.     Appearance: She is obese. She is not ill-appearing or toxic-appearing.  HENT:     Head: Normocephalic.     Mouth/Throat:     Mouth: Mucous membranes are moist.  Eyes:     Conjunctiva/sclera: Conjunctivae normal.     Pupils: Pupils are equal, round, and reactive to light.  Cardiovascular:     Rate and Rhythm: Normal rate and regular  rhythm.  Pulmonary:     Effort: Pulmonary effort is normal.     Breath sounds: Normal breath sounds.  Abdominal:     Comments: gravid  Musculoskeletal:        General: Normal range of motion.     Cervical back: Normal range of motion.  Skin:    General: Skin is warm.     Findings: No rash.  Neurological:     General: No focal deficit present.     Mental Status: She is alert.  Psychiatric:        Mood and Affect: Mood normal.       Lab Results Lab Results  Component Value Date   WBC 18.9 (H) 05/21/2020   HGB 9.2 (L) 05/21/2020   HCT 28.6 (L) 05/21/2020   MCV 83.6 05/21/2020   PLT 204 05/21/2020   PLT 211 05/21/2020    Lab Results  Component Value Date   CREATININE 0.66 05/21/2020   BUN <5 (L) 05/21/2020   NA 133 (L) 05/21/2020   K 3.7 05/21/2020   CL 103 05/21/2020   CO2 18 (L) 05/21/2020    Lab Results  Component Value Date   ALT 13 05/21/2020   AST 18 05/21/2020   ALKPHOS 103 05/21/2020   BILITOT 0.5 05/21/2020      Microbiology: Recent Results (from the past 240 hour(s))  Resp Panel by RT-PCR (Flu A&B, Covid) Nasopharyngeal Swab     Status: None   Collection Time: 05/19/20  6:31 PM   Specimen: Nasopharyngeal Swab; Nasopharyngeal(NP) swabs in vial transport medium  Result Value Ref Range Status   SARS Coronavirus 2 by RT PCR NEGATIVE NEGATIVE Final    Comment: (NOTE) SARS-CoV-2 target nucleic acids are NOT DETECTED.  The SARS-CoV-2 RNA is generally detectable in upper respiratory specimens during the acute phase of infection. The lowest concentration of SARS-CoV-2 viral copies this assay can detect is 138 copies/mL. A negative result does not preclude SARS-Cov-2 infection and should not be used as the sole basis for treatment or other patient management decisions. A negative result may occur with  improper specimen collection/handling, submission of specimen other than nasopharyngeal swab, presence of viral mutation(s) within the areas targeted by  this assay, and inadequate number of viral copies(<138 copies/mL). A negative result must be combined with clinical observations, patient history, and epidemiological information. The expected result is Negative.  Fact Sheet for Patients:  BloggerCourse.com  Fact Sheet for Healthcare Providers:  SeriousBroker.it  This test is no t yet approved or cleared by the [place] FDA and  has been authorized for detection and/or diagnosis of SARS-CoV-2 by FDA under an Emergency Use Authorization (EUA). This EUA will remain  in effect (meaning this test can be used) for the duration of the COVID-19 declaration under Section 564(b)(1) of the Act, 21 U.S.C.section 360bbb-3(b)(1), unless the authorization is terminated  or revoked sooner.       Influenza A by PCR NEGATIVE NEGATIVE Final   Influenza B by PCR NEGATIVE  NEGATIVE Final    Comment: (NOTE) The Xpert Xpress SARS-CoV-2/FLU/RSV plus assay is intended as an aid in the diagnosis of influenza from Nasopharyngeal swab specimens and should not be used as a sole basis for treatment. Nasal washings and aspirates are unacceptable for Xpert Xpress SARS-CoV-2/FLU/RSV testing.  Fact Sheet for Patients: BloggerCourse.com  Fact Sheet for Healthcare Providers: SeriousBroker.it  This test is not yet approved or cleared by the [place] FDA and has been authorized for detection and/or diagnosis of SARS-CoV-2 by FDA under an Emergency Use Authorization (EUA). This EUA will remain in effect (meaning this test can be used) for the duration of the COVID-19 declaration under Section 564(b)(1) of the Act, 21 U.S.C. section 360bbb-3(b)(1), unless the authorization is terminated or revoked.  Performed at [facility], [address], [place], [place] [address]   Urine culture     Status: Abnormal (Preliminary result)   Collection Time:  05/19/20  6:38 PM   Specimen: Urine, Random  Result Value Ref Range Status   Specimen Description URINE, RANDOM  Final   Special Requests   Final    NONE Performed at [facility], [address], [place], [place] [address]    Culture >=100,000 COLONIES/mL GRAM NEGATIVE RODS (A)  Final   Report Status PENDING  Incomplete  Culture, blood (Routine X 2) w Reflex to ID Panel     Status: None (Preliminary result)   Collection Time: 05/19/20  8:01 PM   Specimen: BLOOD RIGHT ARM  Result Value Ref Range Status   Specimen Description BLOOD RIGHT ARM  Final   Special Requests   Final    BOTTLES DRAWN AEROBIC AND ANAEROBIC Blood Culture adequate volume   Culture   Final    NO GROWTH < 12 HOURS Performed at [facility], [address], [place], [place] [address]    Report Status PENDING  Incomplete  Culture, blood (Routine X 2) w Reflex to ID Panel     Status: Abnormal (Preliminary result)   Collection Time: 05/19/20  8:01 PM   Specimen: BLOOD RIGHT ARM  Result Value Ref Range Status   Specimen Description BLOOD RIGHT ARM  Final   Special Requests   Final    BOTTLES DRAWN AEROBIC AND ANAEROBIC Blood Culture adequate volume   Culture  Setup Time   Final    GRAM NEGATIVE RODS ANAEROBIC BOTTLE ONLY CRITICAL RESULT CALLED TO, READ BACK BY AND VERIFIED WITH: PHARMD S KOEN [id number] MLM    Culture (A)  Final    ESCHERICHIA COLI SUSCEPTIBILITIES TO FOLLOW Performed at [facility], [address], [place], [place] [address]    Report Status PENDING  Incomplete  Blood Culture ID Panel (Reflexed)     Status: Abnormal   Collection Time: 05/19/20  8:01 PM  Result Value Ref Range Status   Enterococcus faecalis NOT DETECTED NOT DETECTED Final   Enterococcus Faecium NOT DETECTED NOT DETECTED Final   Listeria monocytogenes NOT DETECTED NOT DETECTED Final   Staphylococcus species NOT DETECTED NOT DETECTED Final   Staphylococcus aureus (BCID) NOT DETECTED NOT DETECTED Final    Staphylococcus epidermidis NOT DETECTED NOT DETECTED Final   Staphylococcus lugdunensis NOT DETECTED NOT DETECTED Final   Streptococcus species NOT DETECTED NOT DETECTED Final   Streptococcus agalactiae NOT DETECTED NOT DETECTED Final   Streptococcus pneumoniae NOT DETECTED NOT DETECTED Final   Streptococcus pyogenes NOT DETECTED NOT DETECTED Final   A.calcoaceticus-baumannii NOT DETECTED NOT DETECTED  Final   Bacteroides fragilis NOT DETECTED NOT DETECTED Final   Enterobacterales DETECTED (A) NOT DETECTED Final    Comment: Enterobacterales represent a large order of gram negative bacteria, not a single organism. CRITICAL RESULT CALLED TO, READ BACK BY AND VERIFIED WITH: PHARMD S KOEN [id number] MLM    Enterobacter cloacae complex NOT DETECTED NOT DETECTED Final   Escherichia coli DETECTED (A) NOT DETECTED Final    Comment: CRITICAL RESULT CALLED TO, READ BACK BY AND VERIFIED WITH: PHARMD S KOEN [id number] MLM    Klebsiella aerogenes NOT DETECTED NOT DETECTED Final   Klebsiella oxytoca NOT DETECTED NOT DETECTED Final   Klebsiella pneumoniae NOT DETECTED NOT DETECTED Final   Proteus species NOT DETECTED NOT DETECTED Final   Salmonella species NOT DETECTED NOT DETECTED Final   Serratia marcescens NOT DETECTED NOT DETECTED Final   Haemophilus influenzae NOT DETECTED NOT DETECTED Final   Neisseria meningitidis NOT DETECTED NOT DETECTED Final   Pseudomonas aeruginosa NOT DETECTED NOT DETECTED Final   Stenotrophomonas maltophilia NOT DETECTED NOT DETECTED Final   Candida albicans NOT DETECTED NOT DETECTED Final   Candida auris NOT DETECTED NOT DETECTED Final   Candida glabrata NOT DETECTED NOT DETECTED Final   Candida krusei NOT DETECTED NOT DETECTED Final   Candida parapsilosis NOT DETECTED NOT DETECTED Final   Candida tropicalis NOT DETECTED NOT DETECTED Final   Cryptococcus neoformans/gattii NOT DETECTED NOT DETECTED Final   CTX-M ESBL NOT DETECTED NOT DETECTED Final   Carbapenem  resistance IMP NOT DETECTED NOT DETECTED Final   Carbapenem resistance KPC NOT DETECTED NOT DETECTED Final   Carbapenem resistance NDM NOT DETECTED NOT DETECTED Final   Carbapenem resist OXA 48 LIKE NOT DETECTED NOT DETECTED Final   Carbapenem resistance VIM NOT DETECTED NOT DETECTED Final    Comment: Performed at [facility], [address], [place], [place] [address]  Culture, blood (Routine X 2) w Reflex to ID Panel     Status: None (Preliminary result)   Collection Time: 05/19/20  9:35 PM   Specimen: BLOOD LEFT HAND  Result Value Ref Range Status   Specimen Description BLOOD LEFT HAND  Final   Special Requests   Final    BOTTLES DRAWN AEROBIC AND ANAEROBIC Blood Culture adequate volume   Culture   Final    NO GROWTH < 12 HOURS Performed at [facility], [address], [place], [place] [address]    Report Status PENDING  Incomplete  Wet prep, genital     Status: Abnormal   Collection Time: 05/21/20 10:45 AM   Specimen: Vaginal/Rectal  Result Value Ref Range Status   Yeast Wet Prep HPF POC NONE SEEN NONE SEEN Final   Trich, Wet Prep NONE SEEN NONE SEEN Final   Clue Cells Wet Prep HPF POC NONE SEEN NONE SEEN Final   WBC, Wet Prep HPF POC MANY (A) NONE SEEN Final   Sperm NONE SEEN  Final    Comment: Performed at [facility], [address] [place], [place] [address]     Serology:    Imaging: If present, new imagings (plain films, ct scans, and mri) have been personally visualized and interpreted; radiology reports have been reviewed. Decision making incorporated into the Impression / Recommendations.  5/01 renal u/s 1. Moderate bilateral hydronephrosis, which could be due to mass effect from gravid uterus. No evidence of nephrolithiasis. 2. Normal renal cortical echotexture.  No focal abnormalities.  5/01 cxr No active disease  Jaaziel Peatross T Mackay Hanauer, MD [facility] [phone] pager    05/21/2020, 11:31  AM

## 2020-05-22 ENCOUNTER — Encounter (HOSPITAL_COMMUNITY): Payer: Self-pay | Admitting: Obstetrics and Gynecology

## 2020-05-22 DIAGNOSIS — D509 Iron deficiency anemia, unspecified: Secondary | ICD-10-CM

## 2020-05-22 DIAGNOSIS — O9903 Anemia complicating the puerperium: Secondary | ICD-10-CM

## 2020-05-22 LAB — CULTURE, BLOOD (ROUTINE X 2): Special Requests: ADEQUATE

## 2020-05-22 LAB — CBC
HCT: 26.7 % — ABNORMAL LOW (ref 36.0–46.0)
Hemoglobin: 8.4 g/dL — ABNORMAL LOW (ref 12.0–15.0)
MCH: 26.5 pg (ref 26.0–34.0)
MCHC: 31.5 g/dL (ref 30.0–36.0)
MCV: 84.2 fL (ref 80.0–100.0)
Platelets: 243 10*3/uL (ref 150–400)
RBC: 3.17 MIL/uL — ABNORMAL LOW (ref 3.87–5.11)
RDW: 15.4 % (ref 11.5–15.5)
WBC: 16.2 10*3/uL — ABNORMAL HIGH (ref 4.0–10.5)
nRBC: 0.1 % (ref 0.0–0.2)

## 2020-05-22 LAB — GC/CHLAMYDIA PROBE AMP (~~LOC~~) NOT AT ARMC
Chlamydia: NEGATIVE
Comment: NEGATIVE
Comment: NORMAL
Neisseria Gonorrhea: NEGATIVE

## 2020-05-22 LAB — URINE CULTURE: Culture: >=100000 — AB

## 2020-05-22 MED ORDER — CEFAZOLIN SODIUM-DEXTROSE 2-4 GM/100ML-% IV SOLN
2.0000 g | Freq: Three times a day (TID) | INTRAVENOUS | Status: DC
  Filled 2020-05-22 (×2): qty 100

## 2020-05-22 MED ORDER — SODIUM CHLORIDE 0.9 % IV SOLN
500.0000 mg | Freq: Once | INTRAVENOUS | Status: AC
  Administered 2020-05-22: 500 mg via INTRAVENOUS
  Filled 2020-05-22: qty 25

## 2020-05-22 MED ORDER — CEPHALEXIN 500 MG PO CAPS: 500.0000 mg | ORAL_CAPSULE | Freq: Three times a day (TID) | ORAL | Status: DC

## 2020-05-22 MED ORDER — CEPHALEXIN 500 MG PO CAPS
500.0000 mg | ORAL_CAPSULE | Freq: Four times a day (QID) | ORAL | Status: DC
  Administered 2020-05-22 – 2020-05-24 (×7): 500 mg via ORAL
  Filled 2020-05-22 (×9): qty 1

## 2020-05-22 NOTE — Lactation Note (Signed)
This note was copied from a baby's chart. Lactation Consultation Note  Patient Name: Cathy Roman Drummond [id number] Date: 05/22/2020 Reason for consult: Initial assessment;NICU baby;Preterm <34wks;Infant < 6lbs;Multiple gestation Age:25 hours   Twins CGA [redacted]w[redacted]d in NICU.  LC attempted consult but mother in bathroom.  2nd attempt spoke with mother prior to her visiting with her twins for the first time since birth. Reviewed hand expression and set up DEBP.  Faxed pump referral to [facility].  Mother states she will come back after visit and pump.  Mother may need pump review. Fitted with 24 mm flange.  Mother states size is comfortable but will recheck when she pumps for full session.  Mother informed to increase size if needed.   Provided mother with NICU booklet and lactation brochure for resources and phone #.    Lactation Tools Discussed/Used Tools: Pump Breast pump type: Double-Electric Breast Pump Pump Education: Setup, frequency, and cleaning;Milk Storage Reason for Pumping: maternal separation from infants Pumping frequency: q 3 hours  Interventions Interventions: Hand express;DEBP;Education  Discharge WIC Program: Yes  Consult Status Consult Status: Follow-up Date: 05/23/20 Follow-up type: In-patient    [name] [facility] 05/22/2020, 9:11 AM

## 2020-05-22 NOTE — Progress Notes (Signed)
Chart reviewed  Patient subsequently went into preterm labor and c-section She remains on ceftriaxone for pyelo  bcx returns pan-sensitive ecoli   -can transition to cefazolin 2 gram iv q8hours now. If taking PO well, can transition to cephalexin 500 mg po qid  -total all abx duration is 10 days to finish on 05/29/2020  -please call id if further question, will sign off

## 2020-05-22 NOTE — Progress Notes (Signed)
POSTPARTUM PROGRESS NOTE  POD #1  Subjective:  Cathy Roman is a 25 y.o. [phone] s/p primary LTCS at 30w4 due to preterm labor, fetal malpresentation (br/br) and pyelonephritis.  She reports she doing well. No acute events overnight. She denies any problems with ambulating or po intake.  Foley is still in place.  Denies nausea or vomiting. She has  passed flatus. Pain is well controlled.  Lochia is WNL.  Objective: Blood pressure (!) 113/52, pulse 64, temperature 98.2 F (36.8 C), temperature source Tympanic, resp. rate 18, height 5\' 4"  (1.626 m), weight 121.2 kg, last menstrual period 10/20/2019, SpO2 97 %, unknown if currently breastfeeding.  Physical Exam:  General: alert, cooperative and no distress Chest: no respiratory distress Heart:regular rate, distal pulses intact Abdomen: soft, nontender,obese, positive bowel sounds Uterine Fundus: firm, appropriately tender DVT Evaluation: No calf swelling or tenderness Extremities: minimal edema Skin: warm, dry; incision clean/dry/intact w/ honeycomb dressing in place, no staining of dressing  Recent Labs    05/21/20 1102 05/22/20 0516  HGB 9.2* 8.4*  HCT 28.6* 26.7*    Assessment/Plan: Cathy Roman is a 25 y.o. G1P0102 s/p primary cesarean section for breech/breech twins and preterm labor  at [redacted]w[redacted]d.  POD#1 -  Twins are in the NICU Continue routine postpartum care Iron infusion for anemia   LOS: 1 day   [redacted]w[redacted]d, Md Faculty Attending, [facility] 05/22/2020, 7:19 AM

## 2020-05-23 DIAGNOSIS — O8621 Infection of kidney following delivery: Secondary | ICD-10-CM

## 2020-05-23 LAB — CULTURE, BETA STREP (GROUP B ONLY): Special Requests: NORMAL

## 2020-05-23 MED ORDER — FUROSEMIDE 40 MG PO TABS
20.0000 mg | ORAL_TABLET | Freq: Every day | ORAL | Status: DC
  Administered 2020-05-23 – 2020-05-24 (×2): 20 mg via ORAL
  Filled 2020-05-23 (×2): qty 1

## 2020-05-23 MED ORDER — ESCITALOPRAM OXALATE 10 MG PO TABS
10.0000 mg | ORAL_TABLET | Freq: Every day | ORAL | Status: DC
  Administered 2020-05-23 – 2020-05-24 (×2): 10 mg via ORAL
  Filled 2020-05-23 (×2): qty 1

## 2020-05-23 MED ORDER — POTASSIUM CHLORIDE CRYS ER 20 MEQ PO TBCR
20.0000 meq | EXTENDED_RELEASE_TABLET | Freq: Every day | ORAL | Status: DC
  Administered 2020-05-23 – 2020-05-24 (×2): 20 meq via ORAL
  Filled 2020-05-23 (×2): qty 1

## 2020-05-23 NOTE — Progress Notes (Signed)
POSTPARTUM PROGRESS NOTE  POD #2  Subjective:  Cathy Roman is a 25 y.o. [phone] s/p primary LTCS at 30w4 due to preterm labor, fetal malpresentation (br/br twins) and pyelonephritis.  She reports she doing well. No acute events overnight. She denies any problems with ambulating or po intake. Denies nausea or vomiting. She has passed flatus. Pain is well controlled.  Lochia is WNL.  Objective: Blood pressure 113/68, pulse 84, temperature 97.6 F (36.4 C), temperature source Oral, resp. rate 17, height 5\' 4"  (1.626 m), weight 121.2 kg, last menstrual period 10/20/2019, SpO2 98 %, unknown if currently breastfeeding.  Physical Exam:  General: alert, cooperative and no distress Chest: no respiratory distress Heart:regular rate, distal pulses intact Abdomen: soft, nontender,obese, positive bowel sounds Uterine Fundus: firm, appropriately tender DVT Evaluation: No calf swelling or tenderness Extremities: minimal edema Skin: warm, dry; incision clean/dry/intact w/ honeycomb dressing in place, no staining of dressing  Recent Labs    05/21/20 1102 05/22/20 0516  HGB 9.2* 8.4*  HCT 28.6* 26.7*    Assessment/Plan: Cathy Roman is a 25 y.o. G1P0102 s/p primary cesarean section for breech/breech twins and preterm labor  at [redacted]w[redacted]d, in the setting of pyelonephritis.  POD#2 -  Twins are in the NICU, doing well as per patient Continue routine postpartum care Iron infusion given  for anemia yesterday Continue antibiotics for pyelonephritis treatment Discharge to home tomorrow if remains stable   LOS: 2 days   [redacted]w[redacted]d, MD Faculty Attending, [facility] 05/23/2020, 11:19 AM

## 2020-05-23 NOTE — Progress Notes (Signed)
Pt. Off unit. ?

## 2020-05-23 NOTE — Lactation Note (Signed)
This note was copied from a baby's chart. Lactation Consultation Note  Patient Name: Cathy Roman Huskins [id number] Date: 05/23/2020 Reason for consult: Follow-up assessment;NICU baby Age:25 hours  Lactation followed up with Ms. [name]. She states that she has a good understanding of how to use her breast pump. She has some EBM in her refrigerator from last night. She has not pumped since that time. I encouraged her to store milk from each pumped session in a separate container and to label to pump date and time with her EBM. I encouraged her to take her milk to NICU when she visits her babies.  I encouraged her to pump 8 times a day and discussed how her milk will mature/change within the first three-five days. I discussed how pumping will support milk production.   [name] needed breast milk storage labels and bottles. I provided her with bottles and contacted the NICU RN regarding labels.   We reviewed the pump settings. [name] did not have any questions or concerns at this time. I encouraged her to also pump in the NICU setting, when able.  Feeding Mother's Current Feeding Choice: Breast Milk and Donor Milk   Interventions Interventions: Breast feeding basics reviewed;DEBP;Education  Discharge    Consult Status Consult Status: Follow-up Date: 05/24/20 Follow-up type: In-patient    Walker Shadow 05/23/2020, 10:37 AM

## 2020-05-23 NOTE — Progress Notes (Signed)
CSW attempted to met with MOB after 1pm per MOB's request. CSW observed MOB resting in bed, while female was on couch. MOB reported, she was tired. CSW plan to complete assessment prior to MOB's d/c.   Zaleigh Bermingham, MSW, LCSW-A Clinical Social Worker [phone]  

## 2020-05-23 NOTE — Progress Notes (Signed)
CSW attempted to meet with MOB at twins bedside to complete a clinical assessment. When CSW arrived, the twins were receiving care from NCIU medical team. CSW will attempt to visit with MOB at a later time.   CSW will continue to offer resources and supports to family while infant remains in NICU.    Mikie Misner Boyd-Gilyard, MSW, LCSW Clinical Social Work [phone] 

## 2020-05-23 NOTE — Progress Notes (Signed)
CSW attempted to complete assessment with MOB for, hx of anxiety, MDD, and  twins NICU admission. CSW observed MOB resting in bed while speaking with RN. CSW asked MOB if it was a good time to speak. MOB requested CSW to come back around 1pm. CSW agreed.   Tychelle Purkey, MSW, LCSW-A Clinical Social Worker [phone]  

## 2020-05-24 LAB — CULTURE, BLOOD (ROUTINE X 2)
Culture: NO GROWTH
Special Requests: ADEQUATE
Special Requests: ADEQUATE

## 2020-05-24 LAB — SURGICAL PATHOLOGY

## 2020-05-24 MED ORDER — CEFADROXIL 1 G PO TABS: 1.0000 g | ORAL_TABLET | Freq: Two times a day (BID) | ORAL | 0 refills | Status: DC

## 2020-05-24 MED ORDER — OXYCODONE HCL 5 MG PO TABS: 5.0000 mg | ORAL_TABLET | Freq: Four times a day (QID) | ORAL | 0 refills | Status: DC | PRN

## 2020-05-24 MED ORDER — IBUPROFEN 600 MG PO TABS: 600.0000 mg | ORAL_TABLET | Freq: Four times a day (QID) | ORAL | 2 refills | Status: DC | PRN

## 2020-05-24 MED ORDER — DOCUSATE SODIUM 100 MG PO CAPS: 100.0000 mg | ORAL_CAPSULE | Freq: Two times a day (BID) | ORAL | 2 refills | Status: DC | PRN

## 2020-05-24 NOTE — Progress Notes (Signed)
D/c instructions given, all questions answered, and pt verbalized understanding. Pt is alert and oriented, pt states pain tolerable, and she is ambulatory.

## 2020-05-24 NOTE — Clinical Social Work Maternal (Signed)
CLINICAL SOCIAL WORK MATERNAL/CHILD NOTE  Patient Details  Name: Cathy Roman MRN: [id number] Date of Birth: 11/17/1995  Date:  05/24/2020  Clinical Social Worker Initiating Note:  Terence Googe Boyd-Gilyard Date/Time: Initiated:  05/24/20/1053     Child's Name:  Cathy Roman   Biological Parents:  Mother,Father (FOB is Greg Hunt 04/25/1998)   Need for Interpreter:  None   Reason for Referral:  Behavioral Health Concerns   Address:  [address] [place] [address]    Phone number:  [phone] (home)     Additional phone number:   Household Members/Support Persons (HM/SP):    (MOB reported she resides with her parents and MOB's little brother.)   HM/SP Name Relationship DOB or Age  HM/SP -1        HM/SP -2        HM/SP -3        HM/SP -4        HM/SP -5        HM/SP -6        HM/SP -7        HM/SP -8          Natural Supports (not living in the home):  Extended Family,Immediate Family,Parent,Spouse/significant other   Professional Supports: None   Employment: Part-time   Type of Work: Sheets   Education:  High school graduate   Homebound arranged:    Financial Resources:      Other Resources:  WIC,Food Stamps    Cultural/Religious Considerations Which May Impact Care: None reported  Strengths:  Ability to meet basic needs ,Home prepared for child ,Understanding of illness (MOB expressed that she is uncertain of peds follow-up however she has 2 in mind.)   Psychotropic Medications:         Pediatrician:       Pediatrician List:   [place]    [place]    [place]    [place]    [place]    [place]      Pediatrician Fax Number:    Risk Factors/Current Problems:  Mental Health Concerns    Cognitive State:  Able to Concentrate ,Alert ,Insightful ,Linear Thinking    Mood/Affect:  Interested ,Comfortable ,Happy ,Relaxed    CSW Assessment: CSW met with MOB in room 109 to complete and  assessment for hx of anxiety and depression. When CSW arrived, MOB was alone and was resting in bed. MOB was receptive to meeting with CSW. MOB was polite and easy to engage.   CSW asked MOB about her thoughts and feelings regarding twins NICU admission. MOB communicated that she understandings why they have to be admitted "And I know this is what is best for them but I do feel a little overwhelm." CSW validated and normalized MOB's thoughts and feelings and shared other emotions that MOB may experience during the postpartum period. MOB acknowledged a hx of anxiety and depression and reported she was dx in the 8th grade. Per MOB her current symptoms are being managed with Lexapro. CSW provided education regarding the baby blues period vs. perinatal mood disorders, discussed treatment and gave resources for mental health follow up if concerns arise.  CSW recommends self-evaluation during the postpartum time period using the New Mom Checklist from Postpartum Progress and encouraged MOB to contact a medical professional if symptoms are noted at any time. MOB presented with insight and awareness and did not demonstrate any acute MH symptoms. CSW assessed for safety and MOB denied   SI,HI, and DV.   CSW provided review of Sudden Infant Death Syndrome (SIDS) precautions.    CSW also reviewed NICU visitation policy and without prompting, MOB made CSW aware that FOB was added as the additional support person and MGM was placed as the support person. Per MOB, "My moms schedule is more flexible and she can be her longer than Greg." MOB also shared that she consented for MGM to get medical records and updates. MOB communicated "My mom is a nurse and it's helpful that she gets updates and explain things to me."  CSW asked if MOB felt well informed by medical team and MOB responded, "Oh yes."   MOB reported having all essential items for twins including 2 used car seats (not expired) and a twin bassinet. MOB also shared  having a good support team that she feels comfortable reaching out to if needed.   CSW will continue to offer resources and supports to family while twins remain in NICU.    CSW Plan/Description:  No Further Intervention Required/No Barriers to Discharge,Sudden Infant Death Syndrome (SIDS) Education,Perinatal Mood and Anxiety Disorder (PMADs) Education,Other Patient/Family Education,Other Information/Referral to Community Resources   Timohty Renbarger Boyd-Gilyard, MSW, LCSW Clinical Social Work [phone]  Sade Hollon D BOYD-GILYARD, LCSW 05/24/2020, 11:00 AM  

## 2020-05-24 NOTE — Plan of Care (Signed)
  Problem: Education: Goal: Knowledge of disease or condition will improve Outcome: Adequate for Discharge Goal: Knowledge of the prescribed therapeutic regimen will improve Outcome: Adequate for Discharge Goal: Individualized Educational Video(s) Outcome: Adequate for Discharge   Problem: Clinical Measurements: Goal: Complications related to the disease process, condition or treatment will be avoided or minimized Outcome: Adequate for Discharge   Problem: Education: Goal: Knowledge of General Education information will improve Description: Including pain rating scale, medication(s)/side effects and non-pharmacologic comfort measures Outcome: Adequate for Discharge   Problem: Health Behavior/Discharge Planning: Goal: Ability to manage health-related needs will improve Outcome: Adequate for Discharge   Problem: Clinical Measurements: Goal: Ability to maintain clinical measurements within normal limits will improve Outcome: Adequate for Discharge Goal: Will remain free from infection Outcome: Adequate for Discharge Goal: Diagnostic test results will improve Outcome: Adequate for Discharge Goal: Respiratory complications will improve Outcome: Adequate for Discharge Goal: Cardiovascular complication will be avoided Outcome: Adequate for Discharge   Problem: Activity: Goal: Risk for activity intolerance will decrease Outcome: Adequate for Discharge   Problem: Nutrition: Goal: Adequate nutrition will be maintained Outcome: Adequate for Discharge   Problem: Coping: Goal: Level of anxiety will decrease Outcome: Adequate for Discharge   Problem: Elimination: Goal: Will not experience complications related to bowel motility Outcome: Adequate for Discharge Goal: Will not experience complications related to urinary retention Outcome: Adequate for Discharge   Problem: Pain Managment: Goal: General experience of comfort will improve Outcome: Adequate for Discharge   Problem:  Safety: Goal: Ability to remain free from injury will improve Outcome: Adequate for Discharge   Problem: Skin Integrity: Goal: Risk for impaired skin integrity will decrease Outcome: Adequate for Discharge   Problem: Education: Goal: Knowledge of condition will improve Outcome: Adequate for Discharge Goal: Individualized Educational Video(s) Outcome: Adequate for Discharge Goal: Individualized Newborn Educational Video(s) Outcome: Adequate for Discharge   Problem: Activity: Goal: Will verbalize the importance of balancing activity with adequate rest periods Outcome: Adequate for Discharge Goal: Ability to tolerate increased activity will improve Outcome: Adequate for Discharge   Problem: Coping: Goal: Ability to identify and utilize available resources and services will improve Outcome: Adequate for Discharge   Problem: Life Cycle: Goal: Chance of risk for complications during the postpartum period will decrease Outcome: Adequate for Discharge   Problem: Role Relationship: Goal: Ability to demonstrate positive interaction with newborn will improve Outcome: Adequate for Discharge   Problem: Skin Integrity: Goal: Demonstration of wound healing without infection will improve Outcome: Adequate for Discharge

## 2020-05-24 NOTE — Lactation Note (Signed)
This note was copied from a baby's chart. Lactation Consultation Note  Patient Name: Cathy Roman Date: 05/24/2020 Reason for consult: Follow-up assessment;Primapara;1st time breastfeeding;NICU baby;Multiple gestation;Preterm <34wks Age:25 hours   LC in to visit with Mom of twins in the NICU.   Mom resting in bed and states today is her discharge day. Mom does not have a DEBP at home, but received a call from [facility] and will be returning their call this morning.  Mom aware of [facility] loaner program for weekend discharges. Mom plans to stay with babies today.  Mom is aware of DEBP in babies's room and she has already used it.  Mom aware of importance of disassembling pump parts, washing,rinsing and air drying them.  Engorgement prevention and treatment reviewed. Mom aware of how to contact lactation through the baby's RN.   Encouraged STS when she is able.   Lactation Tools Discussed/Used Tools: Pump Breast pump type: Double-Electric Breast Pump Pumping frequency: Q 3-4 hrs Pumped volume: 0 mL (drops)  Interventions Interventions: Breast feeding basics reviewed;Skin to skin;Breast massage;Hand express;Hand pump;Education  Discharge Discharge Education: Engorgement and breast care  Consult Status Consult Status: Follow-up Date: 05/25/20 Follow-up type: In-patient    Cathy Roman 05/24/2020, 9:01 AM

## 2020-05-24 NOTE — Discharge Instructions (Signed)

## 2020-05-26 ENCOUNTER — Ambulatory Visit: Payer: Self-pay

## 2020-05-26 NOTE — Lactation Note (Signed)
This note was copied from a baby's chart. Lactation Consultation Note LC to room for f/u visit with mother on pp day 5. She is feeling unwell and pumping infrequently. We reviewed the importance of frequent breast stimulation/milk removal. Mother is aware of LC services. Will plan f/u visit.   Patient Name: Cathy Roman Chipman [id number] Date: 05/26/2020 Reason for consult: NICU baby;Follow-up assessment;Multiple gestation Age:25 days  Feeding Mother's Current Feeding Choice: Breast Milk and Donor Milk  Interventions Interventions: Education  Discharge Discharge Education: Engorgement and breast care  Consult Status Consult Status: Follow-up Follow-up type: In-patient   Elder Negus, MA IBCLC 05/26/2020, 2:02 PM

## 2020-05-27 NOTE — H&P (Signed)
History   CSN: [id number]  Arrival date and time: 05/19/20 1759   Event Date/Time   First Provider Initiated Contact with Patient 05/19/20 1830         Chief Complaint  Patient presents with  . Fever  . Flank Pain  . Tachycardia   HPI Cathy Roman is a 25 y.o. G1P0 at [redacted]w[redacted]d who presents with fever, tachycardia and back pain. She states the symptoms started 2 days ago and have progressively gotten worse. She reports some elevated blood pressures over night and denies any hx of hypertension in the pregnancy. She denies HA, visual changes or epigastric pain. She reports the back pain is in her mid back and radiates down to her lower abdomen. She reports the pain is constant and she rates it a 7/10. She has not tried anything for the pain. She reports the fever started this morning and the tachycardia started with the fever. She has not have a COVID or flu vaccine and has not had COVID or the flu. She denies any congestion, sore throat or shortness of breath. She denies any urinary complaints. She denies any bleeding or leaking. Reports normal fetal movement of both babies.           OB History    Gravida  1   Para      Term      Preterm      AB      Living        SAB      IAB      Ectopic      Multiple      Live Births                  Past Medical History:  Diagnosis Date  . ADHD    ADD  . Anxiety disorder   . Chronic migraine without aura, intractable, without status migrainosus   . Depression   . Former cigarette smoker   . Psychiatric disorder          Past Surgical History:  Procedure Laterality Date  . NONE           Family History  Problem Relation Age of Onset  . Mental illness Neg Hx     Social History       Tobacco Use  . Smoking status: Former Games developer  . Smokeless tobacco: Never Used  Vaping Use  . Vaping Use: Never used  Substance Use Topics  . Alcohol use: No  . Drug use: No    Allergies:       Allergies  Allergen Reactions  . Sulfa Antibiotics Hives           Medications Prior to Admission  Medication Sig Dispense Refill Last Dose  . buPROPion (WELLBUTRIN XL) 150 MG 24 hr tablet Take 1 tablet (150 mg total) by mouth daily. 30 tablet 0   . FLUoxetine (PROZAC) 10 MG capsule      . FLUoxetine (PROZAC) 20 MG capsule      . methylphenidate (CONCERTA) 54 MG PO CR tablet      . methylphenidate (RITALIN) 20 MG tablet      . METOPROLOL TARTRATE PO Take 50 mg by mouth 2 (two) times daily.     . Norgestimate-Ethinyl Estradiol Triphasic (TRINESSA, 28,) 0.18/0.215/0.25 MG-35 MCG tablet Take 1 tablet by mouth daily.     . traZODone (DESYREL) 50 MG tablet Take 0.5 tablets (25 mg total) by mouth at bedtime as needed  for sleep (insomnia). 30 tablet 0     Review of Systems  Constitutional: Positive for chills, fatigue and fever.  HENT: Positive for congestion.   Respiratory: Negative.  Negative for shortness of breath.   Cardiovascular: Negative.  Negative for chest pain.  Gastrointestinal: Positive for abdominal pain. Negative for constipation, diarrhea, nausea and vomiting.  Genitourinary: Negative.  Negative for dysuria, vaginal bleeding and vaginal discharge.  Musculoskeletal: Positive for back pain.  Neurological: Negative.  Negative for dizziness and headaches.   Physical Exam   Blood pressure (!) 146/74, pulse (!) 148, temperature 100.3 F (37.9 C), resp. rate (!) 26, last menstrual period 10/20/2019.  Physical Exam Vitals and nursing note reviewed.  Constitutional:      General: She is not in acute distress.    Appearance: She is well-developed. She is ill-appearing.  HENT:     Head: Normocephalic.     Nose: Congestion present.  Eyes:     Pupils: Pupils are equal, round, and reactive to light.  Cardiovascular:     Rate and Rhythm: Normal rate and regular rhythm.     Heart sounds: Normal heart sounds.  Pulmonary:     Effort: Pulmonary  effort is normal. No respiratory distress.     Breath sounds: Normal breath sounds.  Abdominal:     General: Bowel sounds are normal. There is no distension.     Palpations: Abdomen is soft.     Tenderness: There is no abdominal tenderness. There is no right CVA tenderness, left CVA tenderness, guarding or rebound.  Skin:    General: Skin is warm and dry.  Neurological:     Mental Status: She is alert and oriented to person, place, and time.  Psychiatric:        Behavior: Behavior normal.        Thought Content: Thought content normal.        Judgment: Judgment normal.     MAU Course  Procedures Lab Results Last 24 Hours  No results found for this or any previous visit (from the past 24 hour(s)).     MDM Prenatal records from private office reviewed. Pregnancy complicated by di/di twins and UTI in pregnancy. Labs ordered and reviewed.  UA Patient and babies tachycardic upon arrival. CNM called to room immediately for assessment.   LR bolus CBC with Diff, CMP, Protein/creat ratio COVID/Influenza swab Tylenol 1000mg  PO  Care turned over to L. Leftwich-Kirby CNM at 1900. , CNM 05/19/20 6:51 PM   Assessment and Plan    MDM:   Dilaudid 1 mg IV given for pain. COVID and flu negative, u/a with nitrites, 80 ketones.  BP remains elevated, 1 in severe range. Cervix 1/40/ballotable with irritability on FHR monitor.  Consult Dr 07/19/20 with assessment and findings. Admit to [facility] Unit for pyelonephritis. Blood cultures x 2, start Rocephin 2 g Q 24 hours.  Renal Vergie Living ordered. Dr [place] to see pt and reevaluate cervix.  Pt stable at time of transfer to [facility] Unit.   A/P: 1. Pyelonephritis affecting pregnancy in third trimester   2. Dichorionic diamniotic twin pregnancy in third trimester   3. Elevated blood pressure affecting pregnancy in third trimester, antepartum    Admit to [facility] Unit  Vergie Living, CNM 8:18 PM\    Cosigned by:  Sharen Counter, MD at 05/21/2020 12:46 PM  Electronically signed by 07/21/2020, CNM at 05/19/2020 6:53 PM  Electronically signed by 07/19/2020, CNM at 05/19/2020 8:18 PM  Electronically  signed by [place] Bing, MD at 05/21/2020 12:46 PM

## 2020-05-28 ENCOUNTER — Encounter (HOSPITAL_COMMUNITY): Payer: Self-pay | Admitting: Obstetrics and Gynecology

## 2020-05-28 ENCOUNTER — Other Ambulatory Visit: Payer: Self-pay | Admitting: Medical

## 2020-05-28 ENCOUNTER — Other Ambulatory Visit: Payer: Self-pay

## 2020-05-28 ENCOUNTER — Inpatient Hospital Stay (HOSPITAL_COMMUNITY)
Admission: AD | Admit: 2020-05-28 | Discharge: 2020-05-28 | Disposition: A | Discharge status: Home / Self Care | Payer: BC Managed Care – PPO | Attending: Obstetrics and Gynecology | Admitting: Obstetrics and Gynecology

## 2020-05-28 ENCOUNTER — Ambulatory Visit (INDEPENDENT_AMBULATORY_CARE_PROVIDER_SITE_OTHER): Payer: BC Managed Care – PPO

## 2020-05-28 VITALS — BP 134/96 | HR 90 | Ht 64.0 in | Wt 240.8 lb

## 2020-05-28 DIAGNOSIS — Z791 Long term (current) use of non-steroidal anti-inflammatories (NSAID): Secondary | ICD-10-CM | POA: Insufficient documentation

## 2020-05-28 DIAGNOSIS — R519 Headache, unspecified: Secondary | ICD-10-CM | POA: Diagnosis not present

## 2020-05-28 DIAGNOSIS — Z87891 Personal history of nicotine dependence: Secondary | ICD-10-CM | POA: Diagnosis not present

## 2020-05-28 DIAGNOSIS — O165 Unspecified maternal hypertension, complicating the puerperium: Secondary | ICD-10-CM | POA: Diagnosis not present

## 2020-05-28 DIAGNOSIS — Z882 Allergy status to sulfonamides status: Secondary | ICD-10-CM | POA: Diagnosis not present

## 2020-05-28 DIAGNOSIS — Z3009 Encounter for other general counseling and advice on contraception: Secondary | ICD-10-CM

## 2020-05-28 DIAGNOSIS — R030 Elevated blood-pressure reading, without diagnosis of hypertension: Secondary | ICD-10-CM | POA: Insufficient documentation

## 2020-05-28 DIAGNOSIS — O30043 Twin pregnancy, dichorionic/diamniotic, third trimester: Secondary | ICD-10-CM

## 2020-05-28 DIAGNOSIS — Z79899 Other long term (current) drug therapy: Secondary | ICD-10-CM | POA: Insufficient documentation

## 2020-05-28 DIAGNOSIS — O163 Unspecified maternal hypertension, third trimester: Secondary | ICD-10-CM

## 2020-05-28 LAB — COMPREHENSIVE METABOLIC PANEL
ALT: 25 U/L (ref 0–44)
AST: 18 U/L (ref 15–41)
Albumin: 2.9 g/dL — ABNORMAL LOW (ref 3.5–5.0)
Alkaline Phosphatase: 94 U/L (ref 38–126)
Anion gap: 7 (ref 5–15)
BUN: 10 mg/dL (ref 6–20)
CO2: 25 mmol/L (ref 22–32)
Calcium: 9.4 mg/dL (ref 8.9–10.3)
Chloride: 109 mmol/L (ref 98–111)
Creatinine, Ser: 0.56 mg/dL (ref 0.44–1.00)
GFR, Estimated: >60 mL/min (ref >60)
Glucose, Bld: 84 mg/dL (ref 70–99)
Potassium: 4.1 mmol/L (ref 3.5–5.1)
Sodium: 141 mmol/L (ref 135–145)
Total Bilirubin: 0.4 mg/dL (ref 0.3–1.2)
Total Protein: 6.2 g/dL — ABNORMAL LOW (ref 6.5–8.1)

## 2020-05-28 LAB — CBC
HCT: 33.1 % — ABNORMAL LOW (ref 36.0–46.0)
Hemoglobin: 10.3 g/dL — ABNORMAL LOW (ref 12.0–15.0)
MCH: 26.5 pg (ref 26.0–34.0)
MCHC: 31.1 g/dL (ref 30.0–36.0)
MCV: 85.3 fL (ref 80.0–100.0)
Platelets: 463 10*3/uL — ABNORMAL HIGH (ref 150–400)
RBC: 3.88 MIL/uL (ref 3.87–5.11)
RDW: 16.3 % — ABNORMAL HIGH (ref 11.5–15.5)
WBC: 9.2 10*3/uL (ref 4.0–10.5)
nRBC: 0.9 % — ABNORMAL HIGH (ref 0.0–0.2)

## 2020-05-28 LAB — PROTEIN / CREATININE RATIO, URINE
Creatinine, Urine: 81.59 mg/dL
Protein Creatinine Ratio: 0.12 mg/mg{Cre} (ref 0.00–0.15)
Total Protein, Urine: 10 mg/dL

## 2020-05-28 MED ORDER — LACTATED RINGERS IV SOLN: INTRAVENOUS | Status: DC

## 2020-05-28 MED ORDER — NIFEDIPINE ER OSMOTIC RELEASE 30 MG PO TB24: 30.0000 mg | ORAL_TABLET | Freq: Every day | ORAL | 0 refills | Status: DC

## 2020-05-28 MED ORDER — BUTALBITAL-APAP-CAFFEINE 50-325-40 MG PO TABS
2.0000 | ORAL_TABLET | Freq: Once | ORAL | Status: AC
  Administered 2020-05-28: 2 via ORAL
  Filled 2020-05-28: qty 2

## 2020-05-28 MED ORDER — METOCLOPRAMIDE HCL 5 MG/ML IJ SOLN
10.0000 mg | Freq: Once | INTRAMUSCULAR | Status: AC
  Administered 2020-05-28: 10 mg via INTRAVENOUS
  Filled 2020-05-28: qty 2

## 2020-05-28 MED ORDER — DIPHENHYDRAMINE HCL 50 MG/ML IJ SOLN
25.0000 mg | Freq: Once | INTRAMUSCULAR | Status: AC
  Administered 2020-05-28: 25 mg via INTRAVENOUS
  Filled 2020-05-28: qty 1

## 2020-05-28 MED ORDER — DEXAMETHASONE SODIUM PHOSPHATE 10 MG/ML IJ SOLN
10.0000 mg | Freq: Once | INTRAMUSCULAR | Status: AC
  Administered 2020-05-28: 10 mg via INTRAVENOUS
  Filled 2020-05-28: qty 1

## 2020-05-28 MED ORDER — NORETHINDRONE 0.35 MG PO TABS: 1.0000 | ORAL_TABLET | Freq: Every day | ORAL | 11 refills | Status: DC

## 2020-05-28 NOTE — Progress Notes (Signed)
Pt here today for PP BP check and Incision check. Pt states having headaches daily, today being constant. Pt did take Tylenol and Motrin. Pt has only had 1 bottle of water and a biscuit today. Pt advised to eat healthy meals, drink water/fluids, no sodas to keep hydrated. Pt is not currently taking BP meds.   BP: 134/96 90 HR BP retake: 140/86 79 HR.   Reviewed BP with Harlon Flor, PA . Advised to send pt to MAU for BP treatment and labs. Pt agreeable and verbalized understanding of plan of care. Pt  Pt also asking when to restart Concerta, Wellbutrin and Prozac and POPs. Pt states has enough meds for mental health until counselor appt on 06/05/20. Pt ok to restart meds and stop Lexapro Rx.    Incision is clean, dry and intact with steri-strips in place. Removed Steri-strips today with one spot no completely approximated. Replaced with 3 steri-strips in middle of incision. No redness, fever, drainage noted at this time.   Judeth Cornfield, RN  05/28/20

## 2020-05-28 NOTE — MAU Note (Signed)
Sent from MD office for BP evaluation.  Reports H/A that's unrelieved with Ibuprofen & Tylenol.  Denies visual disturbances but endorses epigastric pain.  S/P Cesarean May 03/2020.

## 2020-05-28 NOTE — MAU Provider Note (Addendum)
Chief Complaint:  Headache and BP Evaluation   None    HPI: Cathy Roman is a 25 y.o. G1P0102 s/p C/S on 05/21/20 presents to maternity admissions reporting elevated BP and headache. Patient reports she was sent from the office today for evaluation of BP's and HA. HA started around 0800 this morning and has not resolved with Tylenol or Ibuprofen. Last took around 1:30pm today. She denies vision changes, CP, SOB, or RUQ pain.    Pregnancy Course:   Past Medical History:  Diagnosis Date  . ADHD    ADD  . Anxiety disorder   . Chronic migraine without aura, intractable, without status migrainosus   . Depression   . Former cigarette smoker   . Psychiatric disorder    OB History  Gravida Para Term Preterm AB Living  1 1   1   2   SAB IAB Ectopic Multiple Live Births        1 2    # Outcome Date GA Lbr Len/2nd Weight Sex Delivery Anes PTL Lv  1A Preterm 05/21/20 [redacted]w[redacted]d  1620 g M CS-LTranv Spinal  LIV  1B Preterm 05/21/20 [redacted]w[redacted]d  1670 g F CS-LTranv Spinal  LIV   Past Surgical History:  Procedure Laterality Date  . CESAREAN SECTION  05/21/2020   Procedure: CESAREAN SECTION;  Surgeon: 07/21/2020, MD;  Location: [facility] LD ORS;  Service: Obstetrics;;  . NONE     Family History  Problem Relation Age of Onset  . Mental illness Neg Hx    Social History   Tobacco Use  . Smoking status: Former [employer]  . Smokeless tobacco: Never Used  Vaping Use  . Vaping Use: Never used  Substance Use Topics  . Alcohol use: No  . Drug use: No   Allergies  Allergen Reactions  . Sulfa Antibiotics Hives   Medications Prior to Admission  Medication Sig Dispense Refill Last Dose  . acetaminophen (TYLENOL) 500 MG tablet Take 500 mg by mouth every 6 (six) hours as needed for mild pain.   05/28/2020 at Unknown time  . cefadroxil (DURICEF) 500 MG capsule Take 1,000 mg by mouth 2 (two) times daily.   05/28/2020 at Unknown time  . escitalopram (LEXAPRO) 10 MG tablet Take 10 mg by mouth daily.   05/28/2020 at  Unknown time  . ibuprofen (ADVIL) 600 MG tablet Take 1 tablet (600 mg total) by mouth every 6 (six) hours as needed for headache, mild pain, moderate pain or cramping. 30 tablet 2 05/28/2020 at Unknown time  . Prenatal Vit-Fe Fumarate-FA (MULTIVITAMIN-PRENATAL) 27-0.8 MG TABS tablet Take 1 tablet by mouth daily at 12 noon.   05/28/2020 at Unknown time  . docusate sodium (COLACE) 100 MG capsule Take 1 capsule (100 mg total) by mouth 2 (two) times daily as needed for mild constipation or moderate constipation. (Patient not taking: Reported on 05/28/2020) 30 capsule 2   . oxyCODONE (OXY IR/ROXICODONE) 5 MG immediate release tablet Take 1 tablet (5 mg total) by mouth every 6 (six) hours as needed for moderate pain, severe pain or breakthrough pain. (Patient not taking: Reported on 05/28/2020) 30 tablet 0     I have reviewed patient's Past Medical Hx, Surgical Hx, Family Hx, Social Hx, medications and allergies.   ROS:  Review of Systems  Constitutional: Negative.   HENT: Negative.   Respiratory: Negative.   Cardiovascular: Negative.   Gastrointestinal:       RUQ pain   Genitourinary: Negative.   Musculoskeletal: Negative.  Neurological: Positive for headaches.  Psychiatric/Behavioral: Negative.     Physical Exam   Patient Vitals for the past 24 hrs:  BP Temp Temp src Pulse Resp SpO2 Height Weight  05/28/20 2131 127/87 -- -- 84 -- -- -- --  05/28/20 2116 127/78 -- -- 92 -- -- -- --  05/28/20 2100 128/82 -- -- 88 -- -- -- --  05/28/20 2046 134/83 -- -- 82 -- -- -- --  05/28/20 2015 140/89 -- -- 79 -- -- -- --  05/28/20 2001 (!) 138/93 -- -- 68 -- -- -- --  05/28/20 1946 (!) 146/101 -- -- 72 -- -- -- --  05/28/20 1931 (!) 157/98 -- -- 72 -- -- -- --  05/28/20 1901 (!) 157/96 -- -- 65 -- -- -- --  05/28/20 1846 (!) 153/100 -- -- 63 -- -- -- --  05/28/20 1831 (!) 154/94 -- -- 63 -- -- -- --  05/28/20 1815 (!) 145/94 -- -- 66 -- -- -- --  05/28/20 1801 (!) 145/91 -- -- 71 -- -- -- --   05/28/20 1731 (!) 142/88 -- -- 70 -- -- -- --  05/28/20 1716 135/87 -- -- 72 -- -- -- --  05/28/20 1701 136/90 -- -- 78 -- -- -- --  05/28/20 1658 (!) 142/90 -- -- 75 -- -- -- --  05/28/20 1619 (!) 142/93 98.3 F (36.8 C) Oral 80 20 99 % -- --  05/28/20 1613 -- -- -- -- -- -- 5\' 4"  (1.626 m) 109.5 kg   Constitutional: well-developed, well-nourished female in no acute distress.  Cardiovascular: normal rate Respiratory: normal effort GI: abd soft, non-tender, incision clean/dry/intact, steristrips in place, no signs of wound infection MS: extremities nontender, no edema, normal ROM Neurologic: alert and oriented x 4.  GU: neg CVAT. Pelvic: deferred    Labs: Results for orders placed or performed during the hospital encounter of 05/28/20 (from the past 24 hour(s))  CBC     Status: Abnormal   Collection Time: 05/28/20  5:34 PM  Result Value Ref Range   WBC 9.2 4.0 - 10.5 K/uL   RBC 3.88 3.87 - 5.11 MIL/uL   Hemoglobin 10.3 (L) 12.0 - 15.0 g/dL   HCT 07/28/20 (L) 42.7 - 06.2 %   MCV 85.3 80.0 - 100.0 fL   MCH 26.5 26.0 - 34.0 pg   MCHC 31.1 30.0 - 36.0 g/dL   RDW 37.6 (H) 28.3 - 15.1 %   Platelets 463 (H) 150 - 400 K/uL   nRBC 0.9 (H) 0.0 - 0.2 %  Comprehensive metabolic panel     Status: Abnormal   Collection Time: 05/28/20  5:34 PM  Result Value Ref Range   Sodium 141 135 - 145 mmol/L   Potassium 4.1 3.5 - 5.1 mmol/L   Chloride 109 98 - 111 mmol/L   CO2 25 22 - 32 mmol/L   Glucose, Bld 84 70 - 99 mg/dL   BUN 10 6 - 20 mg/dL   Creatinine, Ser 07/28/20 0.44 - 1.00 mg/dL   Calcium 9.4 8.9 - 6.07 mg/dL   Total Protein 6.2 (L) 6.5 - 8.1 g/dL   Albumin 2.9 (L) 3.5 - 5.0 g/dL   AST 18 15 - 41 U/L   ALT 25 0 - 44 U/L   Alkaline Phosphatase 94 38 - 126 U/L   Total Bilirubin 0.4 0.3 - 1.2 mg/dL   GFR, Estimated 37.1 >06 mL/min   Anion gap 7 5 - 15     Imaging:  MAU Course: Orders Placed This Encounter  Procedures  . CBC  . Comprehensive metabolic panel  . Protein /  creatinine ratio, urine  . Insert peripheral IV   Meds ordered this encounter  Medications  . butalbital-acetaminophen-caffeine (FIORICET) 50-325-40 MG per tablet 2 tablet  . lactated ringers infusion  . metoCLOPramide (REGLAN) injection 10 mg  . dexamethasone (DECADRON) injection 10 mg  . diphenhydrAMINE (BENADRYL) injection 25 mg    MDM: CBC, CMP UPCR Serial BP's with normotensive to mild range BP's Fioricet 2 tabs PO; HA still present, however rates 5/10 Headache cocktail IV ordered, HA now 1/10 Care handed over to Wynelle Bourgeois, CNM   Camelia Eng, CNM 05/28/2020 9:36 PM Results for orders placed or performed during the hospital encounter of 05/28/20 (from the past 24 hour(s))  CBC     Status: Abnormal   Collection Time: 05/28/20  5:34 PM  Result Value Ref Range   WBC 9.2 4.0 - 10.5 K/uL   RBC 3.88 3.87 - 5.11 MIL/uL   Hemoglobin 10.3 (L) 12.0 - 15.0 g/dL   HCT 34.7 (L) 42.5 - 95.6 %   MCV 85.3 80.0 - 100.0 fL   MCH 26.5 26.0 - 34.0 pg   MCHC 31.1 30.0 - 36.0 g/dL   RDW 38.7 (H) 56.4 - 33.2 %   Platelets 463 (H) 150 - 400 K/uL   nRBC 0.9 (H) 0.0 - 0.2 %  Comprehensive metabolic panel     Status: Abnormal   Collection Time: 05/28/20  5:34 PM  Result Value Ref Range   Sodium 141 135 - 145 mmol/L   Potassium 4.1 3.5 - 5.1 mmol/L   Chloride 109 98 - 111 mmol/L   CO2 25 22 - 32 mmol/L   Glucose, Bld 84 70 - 99 mg/dL   BUN 10 6 - 20 mg/dL   Creatinine, Ser 9.51 0.44 - 1.00 mg/dL   Calcium 9.4 8.9 - 88.4 mg/dL   Total Protein 6.2 (L) 6.5 - 8.1 g/dL   Albumin 2.9 (L) 3.5 - 5.0 g/dL   AST 18 15 - 41 U/L   ALT 25 0 - 44 U/L   Alkaline Phosphatase 94 38 - 126 U/L   Total Bilirubin 0.4 0.3 - 1.2 mg/dL   GFR, Estimated >16 >60 mL/min   Anion gap 7 5 - 15  Protein / creatinine ratio, urine     Status: None   Collection Time: 05/28/20  8:20 PM  Result Value Ref Range   Creatinine, Urine 81.59 mg/dL   Total Protein, Urine 10 mg/dL   Protein Creatinine Ratio 0.12  0.00 - 0.15 mg/mg[Cre]   Discussed with Dr Charlotta Newton Will discharge home with Procardia 30mg  XL Message sent to office for return BP check Thurs or Friday Preeclampsia precautions Encouraged to return if she develops worsening of symptoms, increase in pain, fever, or other concerning symptoms.   Thursday, CNM

## 2020-05-28 NOTE — Progress Notes (Signed)
Chart reviewed for nurse visit. Agree with plan of care.   Discussed patient with Dr. Adrian Blackwater. Plan to send to MAU for further evaluation of BP and headache.   Will send Rx for POPs to be started in 3 weeks.   Marny Lowenstein, PA-C 05/28/2020 3:48 PM

## 2020-05-28 NOTE — Discharge Instructions (Signed)
Hypertension During Pregnancy Hypertension is also called high blood pressure. High blood pressure means that the force of the blood moving in your body is high enough to cause problems for you and your baby. Different types of high blood pressure can happen during pregnancy. The types are:  High blood pressure before you got pregnant. This is called chronic hypertension.  This can continue during your pregnancy. Your doctor will want to keep checking your blood pressure. You may need medicine to control your blood pressure while you are pregnant. You will need follow-up visits after you have your baby.  High blood pressure that goes up during pregnancy when it was normal before. This is called gestational hypertension. It will often get better after you have your baby, but your doctor will need to watch your blood pressure to make sure that it is getting better.  You may develop high blood pressure after giving birth. This is called postpartum hypertension. This often occurs within 48 hours after childbirth but may occur up to 6 weeks after giving birth. Very high blood pressure during pregnancy is an emergency that needs treatment right away. How does this affect me? If you have high blood pressure during pregnancy, you have a higher chance of developing high blood pressure:  As you get older.  If you get pregnant again. In some cases, high blood pressure during pregnancy can cause:  Stroke.  Heart attack.  Damage to the kidneys, lungs, or liver.  Preeclampsia.  HELLP syndrome.  Seizures.  Problems with the placenta. How does this affect my baby? Your baby may:  Be born early.  Not weigh as much as he or she should.  Not handle labor well, leading to a C-section. This condition may also result in a baby's death before birth (stillbirth). What are the risks?  Having high blood pressure during a past pregnancy.  Being overweight.  Being age 35 or older.  Being pregnant  for the first time.  Being pregnant with more than one baby.  Becoming pregnant using fertility methods, such as IVF.  Having other problems, such as diabetes or kidney disease. What can I do to lower my risk?  Keep a healthy weight.  Eat a healthy diet.  Follow what your doctor tells you about treating any medical problems that you had before you got pregnant. It is very important to go to all of your doctor visits. Your doctor will check your blood pressure and make sure that your pregnancy is progressing as it should. Treatment should start early if a problem is found.   How is this treated? Treatment for high blood pressure during pregnancy can vary. It depends on the type of high blood pressure you have and how serious it is.  If you were taking medicine for your blood pressure before you got pregnant, talk with your doctor. You may need to change the medicine during pregnancy if it is not safe for your baby.  If your blood pressure goes up during pregnancy, your doctor may order medicine to treat this.  If you are at risk for preeclampsia, your doctor may tell you to take a low-dose aspirin while you are pregnant.  If you have very high blood pressure, you may need to stay in the hospital so you and your baby can be watched closely. You may also need to take medicine to lower your blood pressure.  In some cases, if your condition gets worse, you may need to have your baby early.   Follow these instructions at home: Eating and drinking  Drink enough fluid to keep your pee (urine) pale yellow.  Avoid caffeine.   Lifestyle  Do not smoke or use any products that contain nicotine or tobacco. If you need help quitting, ask your doctor.  Do not use alcohol or drugs.  Avoid stress.  Rest and get plenty of sleep.  Regular exercise can help. Ask your doctor what kinds of exercise are best for you. General instructions  Take over-the-counter and prescription medicines only as  told by your doctor.  Keep all prenatal and follow-up visits. Contact a doctor if:  You have symptoms that your doctor told you to watch for, such as: ? Headaches. ? A feeling like you may vomit (nausea). ? Vomiting. ? Belly (abdominal) pain. ? Feeling dizzy or light-headed. Get help right away if:  You have symptoms of serious problems, such as: ? Very bad belly pain that does not get better with treatment. ? A very bad headache that does not get better. ? Blurry vision. ? Double vision. ? Vomiting that does not get better. ? Sudden, fast weight gain. ? Sudden swelling in your hands, ankles, or face. ? Bleeding from your vagina. ? Blood in your pee. ? Shortness of breath. ? Chest pain. ? Weakness on one side of your body. ? Trouble talking.  Your baby is not moving as much as usual. These symptoms may be an emergency. Get help right away. Call your local emergency services (911 in the [place]  Do not wait to see if the symptoms will go away.  Do not drive yourself to the hospital. Summary  High blood pressure is also called hypertension.  High blood pressure means that the force of the blood moving in your body is high enough to cause problems for you and your baby.  Get help right away if you have symptoms of serious problems due to high blood pressure.  Keep all prenatal and follow-up visits. This information is not intended to replace advice given to you by your health care provider. Make sure you discuss any questions you have with your health care provider. Document Revised: 09/28/2019 Document Reviewed: 09/28/2019 Elsevier Patient Education  2021 [employer].  

## 2020-05-31 ENCOUNTER — Encounter: Payer: Self-pay | Admitting: *Deleted

## 2020-05-31 ENCOUNTER — Ambulatory Visit (INDEPENDENT_AMBULATORY_CARE_PROVIDER_SITE_OTHER): Payer: BC Managed Care – PPO | Admitting: *Deleted

## 2020-05-31 VITALS — BP 135/103 | HR 109 | Ht 64.0 in | Wt 235.0 lb

## 2020-05-31 DIAGNOSIS — F32A Depression, unspecified: Secondary | ICD-10-CM

## 2020-05-31 DIAGNOSIS — Z0130 Encounter for examination of blood pressure without abnormal findings: Secondary | ICD-10-CM

## 2020-05-31 MED ORDER — NIFEDIPINE ER OSMOTIC RELEASE 30 MG PO TB24
60.0000 mg | ORAL_TABLET | Freq: Every day | ORAL | 0 refills | Status: DC
Start: 2020-05-31 — End: 2021-01-14

## 2020-05-31 NOTE — Progress Notes (Signed)
Patient was assessed and managed by nursing staff during this encounter. I have reviewed the chart and agree with the documentation and plan. I have also made any necessary editorial changes. I examined and counseled her and answered her questions.Scheryl Darter, MD 05/31/2020 10:52 AM

## 2020-05-31 NOTE — Progress Notes (Signed)
Pt presents for PP BP check -   She reports having a constant H/A which fades in and out despite taking both Tylenol and Ibuprofen every 6 hours. At time the H/A is very strong. She also reports having chest pain since yesterday which is sharp,stabbing in nature and also at times is just dull. Pt also has back pain @ location of spinal anesthesia as well as Rt lower back pain - similar to when she had kidney infection. She is still taking antibiotic to treat pyelo. Pt expressed concern for feeling a lump above incision on Lt side. Dr. Debroah Loop was consulted and came to room to examine pt. She had negative CVA tenderness. Incision was examined and found to be healing well and was without redness, swelling or drainage. A slightly firm area above Lt side of incision was palpated by Dr. Debroah Loop and felt to be in keeping with normal healing process. Pt was advised to check BP once daily and to increase Procardia dose to 60 mg daily. New Rx was e-prescribed. Pt was also instructed that she may take Unisom to assist with sleep. Pt had elevated PHQ-9 and GAD-7 scores today. She was offered [facility] appt w/[facility] and accepted - will schedule upon check out today. Pt has PP appt scheduled on 5/31. She voiced understanding of all information and instructions given.

## 2020-06-04 ENCOUNTER — Ambulatory Visit: Payer: Self-pay

## 2020-06-04 NOTE — Lactation Note (Addendum)
This note was copied from a baby's chart. Lactation Consultation Note  Patient Name: Cathy Roman [id number] Date: 06/04/2020 Reason for consult: Follow-up assessment;NICU baby;1st time breastfeeding;Primapara;Preterm <34wks;Multiple gestation;Infant < 6lbs Age:25 wk.o.  LC in to visit with P2 Mom of preterm twins.  Mom holding babies STS on her chest as they are being gavage fed.  Mom has been pumping consistently 7 times per 24 hrs, sleeping 6 hrs straight at night and expressing 10 oz total at morning pump.  Mom denies any breast engorgement or pain.  During the day, Mom is pumping every 3 hrs for 5 oz total volume.  Mom has a Symphony DEBP from [facility].   Mom asked about moisture in tubing of pump kit.  Provided Mom with another kit to replace the tubing.  Reviewed washing routine and sanitizing pump parts once a day.   Mom aware she can request lactation by asking RN.  Talked about watching babies for feeding readiness and encouraged STS as much as possible.   Lactation Tools Discussed/Used Tools: Pump Breast pump type: Double-Electric Breast Pump Pumping frequency: 7 times per 24 hrs Pumped volume: 150 mL (Mom expressed 10 oz first thing in am after sleeping 6 hrs)  Interventions Interventions: Breast feeding basics reviewed;Skin to skin;Breast massage;Hand express;DEBP;Education   Consult Status Consult Status: Follow-up Date: 06/11/20 Follow-up type: [place] 06/04/2020, 12:54 PM

## 2020-06-05 DIAGNOSIS — F909 Attention-deficit hyperactivity disorder, unspecified type: Secondary | ICD-10-CM | POA: Diagnosis not present

## 2020-06-05 DIAGNOSIS — F900 Attention-deficit hyperactivity disorder, predominantly inattentive type: Secondary | ICD-10-CM | POA: Diagnosis not present

## 2020-06-05 DIAGNOSIS — F331 Major depressive disorder, recurrent, moderate: Secondary | ICD-10-CM | POA: Diagnosis not present

## 2020-06-05 DIAGNOSIS — F332 Major depressive disorder, recurrent severe without psychotic features: Secondary | ICD-10-CM | POA: Diagnosis not present

## 2020-06-06 ENCOUNTER — Ambulatory Visit: Payer: Self-pay

## 2020-06-06 NOTE — Lactation Note (Signed)
This note was copied from a baby's chart. Lactation Consultation Note  Patient Name: Cathy Roman [id number] Date: 06/06/2020 Reason for consult: Follow-up assessment;Mother's request;Preterm <34wks;NICU baby;Multiple gestation;1st time breastfeeding;Primapara;Infant < 6lbs Age:26 wk.o.   Mom requested to speak with lactation today.  Mom holding both babies STS on her chest as they are sleeping. Mom inquiring when she would be able to start breastfeeding as babies are showing more interest in breastfeeding.  Talked about how feeding readiness is evaluated and importance of consistent wakening and cues at feeding times.  Encouraged Mom to remove her bra and place babies against her skin at next STS time. Babies are small in size and the bra takes up a lot of skin surface, preventing true STS.  Privacy curtain can be pulled.   Talked about SLP evaluation for feeding readiness is a possibility.  Mom also encouraged to be present for rounding (10a-12n) and ask the NNP and Neo.  Babies are 33 weeks tomorrow and shared with her that typically that would be the earliest gestation for po feedings.  [name] is 3 lbs 12.3 oz today [name] is 4 lbs 5 oz today.  Talked about hand expressing a drop of milk onto nipple and doing STS to allow babies to smell and lick at the breast.    Mom is pumping 120 ml per pumping and is pumping every 3 hrs.  Talked about power pumping once a day (10 on 10 off times 3 rounds to simulate a growth spurt) to boost her milk supply as Mom is concerned about her supply.  Reassured her that as babies are maturing and able to latch to breast, this stimulation will enhance her supply.  Encouraged pumping at night at least once.  Lactation Tools Discussed/Used Tools: Pump Breast pump type: Double-Electric Breast Pump Pumping frequency: Q 3hrs Pumped volume: 120 mL  Interventions Interventions: Skin to skin;Breast massage;Hand express;Education;DEBP  Consult  Status Consult Status: Follow-up Date: 06/13/20 Follow-up type: In-patient    Judee Clara 06/06/2020, 1:03 PM

## 2020-06-09 ENCOUNTER — Ambulatory Visit: Payer: Self-pay

## 2020-06-09 NOTE — Lactation Note (Signed)
This note was copied from a baby's chart. Lactation Consultation Note  Patient Name: Cathy Roman Seese [id number] Date: 06/09/2020 Reason for consult: Follow-up assessment;Primapara;1st time breastfeeding;NICU baby;Preterm <34wks;Infant < 6lbs;Multiple gestation Age:25 wk.o.   I followed up with Ms. Winecoff on rounds today. She was holding both babies while they were being fed. She had some questions about pumping and milk supply. I reviewed pumping frequency and milk production. She sometimes pumps 6 times a day for 30 minutes/time. I recommended increasing to 8-10 pumps. We discussed pumping every two hours during the day particularly if she felt full.  She is pumping between 4-6 ounces/pump.  One reason for not reaching 8 pumps a day is the occasional errand that prevents her from pumping on time. I provided her with a manual pump and demonstrated how to use it so she could take a pump with her.  Ms. Dierolf comes most days and spends time with her babies, and she spends the night at her home. She has a Symphony pump at home as well.   Maternal Data Does the patient have breastfeeding experience prior to this delivery?: No  Feeding Mother's Current Feeding Choice: Breast Milk    Lactation Tools Discussed/Used Tools: Pump Breast pump type: Manual Pump Education: Setup, frequency, and cleaning Pumping frequency: 6-8 times/day Pumped volume: 120 mL (mls)  Interventions Interventions: Breast feeding basics reviewed;Education;Hand pump  Discharge Pump: DEBP;Manual  Consult Status Consult Status: Follow-up Date: 06/14/20 Follow-up type: In-patient    Walker Shadow 06/09/2020, 2:49 PM

## 2020-06-10 NOTE — BH Specialist Note (Signed)
Pt did not arrive to video visit and did not answer the phone ; Unable to leave a voicemail as phone is busy after several tries; left MyChart message for patient.

## 2020-06-16 ENCOUNTER — Ambulatory Visit: Payer: Self-pay

## 2020-06-16 NOTE — Lactation Note (Signed)
This note was copied from a baby's chart. Lactation Consultation Note  Patient Name: Cathy Roman Daleo [id number] Date: 06/16/2020 Reason for consult: Follow-up assessment;Multiple gestation;Infant < 6lbs;Preterm <34wks;Other (Comment);Nipple pain/trauma;Primapara;1st time breastfeeding (LC was asked to see this mom due to BF questions by Dr. Eric Form.) Age:25 wk.o.  Per mom - mentioned she overslept her alarm in the middle of the night/ slept 11 hours and woke up engorged both breast and pumped off 13 ozs. And breast are still feeling full and mom  showed LC  the upper areas of both breast / which when assessed and noted to have several groves . Mom aware to pump both  after working with Fabio Neighbors  ( SP ) with nuzzling and attempts to latch.  Mom mentioned her nipples are sore - LC recommended using a dab of coconut oil to pump. Comfort gels alternating with shells.  LC noted areola edema - and suggested mom could even where the shells 10 mins prior to latching to make easier for the babies to obtain depth.  Per mom has been using the #24 F - LC recommended if the breast are overfull to increase flange to #27 F and then bump back down.   F/U LC appt tomorrow 1330  And 1430 for twins.     Maternal Data Has patient been taught Hand Expression?: Yes Does the patient have breastfeeding experience prior to this delivery?: No  Feeding Mother's Current Feeding Choice: Breast Milk  LATCH Score Latch: Repeated attempts needed to sustain latch, nipple held in mouth throughout feeding, stimulation needed to elicit sucking reflex.  Audible Swallowing: None  Type of Nipple: Everted at rest and after stimulation  Comfort (Breast/Nipple): Filling, red/small blisters or bruises, mild/mod discomfort  Hold (Positioning): Assistance needed to correctly position infant at breast and maintain latch.  LATCH Score: 5   Lactation Tools Discussed/Used    Interventions Interventions: Breast feeding  basics reviewed;Assisted with latch;Skin to skin;Breast massage;Reverse pressure;Adjust position;DEBP;Education  Discharge Discharge Education: Engorgement and breast care Pump: Personal;DEBP  Consult Status Consult Status: Follow-up Date: 06/17/20 Follow-up type: In-patient    Matilde Sprang Kross [place] 06/16/2020, 4:00 PM

## 2020-06-16 NOTE — Lactation Note (Signed)
This note was copied from a baby's chart. Lactation Consultation Note  Patient Name: Cathy Roman Date: 06/16/2020   Age:25 wk.o.  Baby A - LC present for latch below.  See Baby B 's chart for details of of breast assessment , sore nipples , and pumping.  LC provided a hand pump , besides her DEBP if she was having problems letting down to the DEBP when engorged she could use the hand pump to get the let down started. LC checked the flange and the #24 F is a good fit.   Maternal Data    Feeding    LATCH Score - Latch score by Cathy Roman ( speech )  Latch: Repeated attempts needed to sustain latch, nipple held in mouth throughout feeding, stimulation needed to elicit sucking reflex.  Audible Swallowing: Spontaneous and intermittent  Type of Nipple: Everted at rest and after stimulation  Comfort (Breast/Nipple): Soft / non-tender  Hold (Positioning): Assistance needed to correctly position infant at breast and maintain latch.  LATCH Score: 8   Lactation Tools Discussed/Used    Interventions Interventions: Breast feeding basics reviewed;Assisted with latch;Breast massage;Hand express;Pre-pump if needed;Reverse pressure;Adjust position  Discharge    Consult Status      Cathy Roman 06/16/2020, 4:35 PM

## 2020-06-17 ENCOUNTER — Ambulatory Visit: Payer: Self-pay

## 2020-06-17 NOTE — Lactation Note (Signed)
This note was copied from a baby's chart. Lactation Consultation Note  Patient Name: Cathy Roman [place] [id number] Date: 06/17/2020 Reason for consult: Follow-up assessment;Multiple gestation;Infant < 6lbs;Preterm <34wks;Nipple pain/trauma;Other (Comment);Primapara;1st time breastfeeding (per mom the coconut oil with pumping is helping and pumping more consistent) Age:25 wk.o.  Baby wide awake , latched for stronger sucks today and few swallows/ per mom more comfortable then yesterday.  LC had mom use the reverse pressure exercise prior to latching to elongate the nipple / areola complex for a deeper latch.  Per mom pumped off 4 oz prior to consult. Maternal Data    Feeding Mother's Current Feeding Choice: Breast Milk  LATCH Score Latch: Repeated attempts needed to sustain latch, nipple held in mouth throughout feeding, stimulation needed to elicit sucking reflex.  Audible Swallowing: A few with stimulation  Type of Nipple: Everted at rest and after stimulation  Comfort (Breast/Nipple): Soft / non-tender  Hold (Positioning): Assistance needed to correctly position infant at breast and maintain latch.  LATCH Score: 7   Lactation Tools Discussed/Used    Interventions Interventions: Breast feeding basics reviewed;Assisted with latch;Skin to skin;Reverse pressure;Adjust position;Support pillows;Coconut oil;Shells;DEBP;Hand pump;Education  Discharge Discharge Education: Engorgement and breast care Pump: DEBP (WIC - DEBP) WIC Program: Yes  Consult Status Consult Status: Follow-up Date: 06/18/20 Follow-up type: In-patient    [name] 06/17/2020, 2:30 PM

## 2020-06-17 NOTE — Lactation Note (Signed)
This note was copied from a baby's chart. Lactation Consultation Note  Patient Name: Cathy Roman ASTMH'D Date: 06/17/2020 Reason for consult: Follow-up assessment;Primapara;1st time breastfeeding;NICU baby;Multiple gestation;Preterm <34wks;Infant < 6lbs Age:25 wk.o.  Baby due for a feeding, after assessment from the NICU, placed baby to the right breast / football / after several attempts latched for a few sucks and off .  Compared to yesterday at the breast / more sluggish.  N/G feeding started / baby more awake but did not latch.   Mom pumped prior to feeding both babies 4 oz.  Mom denies issues with engorgement today/ nipple soreness better.   Maternal Data    Feeding Mother's Current Feeding Choice: Breast Milk  LATCH Score Latch: Repeated attempts needed to sustain latch, nipple held in mouth throughout feeding, stimulation needed to elicit sucking reflex.  Audible Swallowing: None  Type of Nipple: Everted at rest and after stimulation  Comfort (Breast/Nipple): Soft / non-tender  Hold (Positioning): Assistance needed to correctly position infant at breast and maintain latch.  LATCH Score: 6   Lactation Tools Discussed/Used    Interventions Interventions: Breast feeding basics reviewed;Assisted with latch;Adjust position;Support pillows;Position options;DEBP;Education  Discharge    Consult Status Consult Status: Follow-up Date: 06/18/20 Follow-up type: In-patient    Matilde Sprang Deiondra Denley 06/17/2020, 3:07 PM

## 2020-06-18 ENCOUNTER — Other Ambulatory Visit: Payer: Self-pay

## 2020-06-18 ENCOUNTER — Encounter: Payer: Self-pay | Admitting: Obstetrics and Gynecology

## 2020-06-18 ENCOUNTER — Ambulatory Visit (INDEPENDENT_AMBULATORY_CARE_PROVIDER_SITE_OTHER): Payer: BC Managed Care – PPO | Admitting: Obstetrics and Gynecology

## 2020-06-18 NOTE — Patient Instructions (Signed)

## 2020-06-18 NOTE — Progress Notes (Signed)
Post Partum Visit Note  Cathy Roman is a 25 y.o. G23P0102 female who presents for a postpartum visit. She is 4 weeks postpartum following a primary cesarean section for di-di twins for preterm labor at 30 weeks in setting of sepsis due to pyelonephritis.I have fully reviewed the prenatal and intrapartum course. The delivery was at 30.4 gestational weeks.  Anesthesia: spinal. Postpartum course has been overall good.. Babies are doing okay, still in NICU. Baby is feeding by breast. Bleeding staining only. Bowel function is normal. Bladder function is normal. Patient is not sexually active. Contraception method is none. Postpartum depression screening: negative.    The pregnancy intention screening data noted above was reviewed. Potential methods of contraception were discussed. The patient elected to proceed with Abstinence and Oral Contraceptive.    Taking procardia 60 mg. Blood pressures have been appropriate. No headaches, blurred vision.  Seeing psychiatrist and restarted on concerta, wellbutrin, prozac. Feels well supported. Currently declines seeing additional behavioral health counselor.    Health Maintenance Due  Topic Date Due  . COVID-19 Vaccine (1) Never done  . HPV VACCINES (1 - 2-dose series) Never done  . HIV Screening  Never done  . Hepatitis C Screening  Never done  . PAP-Cervical Cytology Screening  Never done  . PAP SMEAR-Modifier  Never done    The following portions of the patient's history were reviewed and updated as appropriate: allergies, current medications, past family history, past medical history, past social history, past surgical history and problem list.  Review of Systems Pertinent items are noted in HPI.  Objective:  LMP  (LMP Unknown)    General:  alert, cooperative and appears stated age   Breasts:  normal  Lungs: clear to auscultation bilaterally  Heart:  regular rate and rhythm, S1, S2 normal, no murmur, click, rub or gallop  Abdomen:  soft, non-tender; bowel sounds normal; no masses,  no organomegaly and incision healing well, no drainage or erythema   Wound well approximated incision  GU exam:  not indicated       Assessment:    There are no diagnoses linked to this encounter.  Normal postpartum exam.   Plan:   Essential components of care per ACOG recommendations:  1.  Mood and well being: Patient with negative depression screening today. Reviewed local resources for support.  - Patient tobacco use? No.   - hx of drug use? No.    2. Infant care and feeding:  -Patient currently breastmilk feeding? Yes. Discussed returning to work and pumping.     3. Sexuality, contraception and birth spacing - Patient does not want a pregnancy in the next year.  - Reviewed forms of contraception in tiered fashion. Patient desired abstinence, oral progesterone-only contraceptive today.  She plans to start her micronor pills at this time.  - Discussed birth spacing of 18 months  4. Sleep and fatigue -Encouraged family/partner/community support of 4 hrs of uninterrupted sleep to help with mood and fatigue  5. Physical Recovery  - Discussed patients delivery and complications.  - Patient had a C-section   - Patient has urinary incontinence? No. - Patient is safe to resume physical and sexual activity  6.  Health Maintenance - HM due items addressed Yes - Last pap smear No results found for: DIAGPAP Pap smear not done at today's visit.  -Breast Cancer screening indicated? No.   7. Chronic Disease/Pregnancy Condition follow up: None  - PCP follow up  Casper Harrison, MD [facility]  Medicine Fellow, [facility] for [employer], [facility]

## 2020-06-22 ENCOUNTER — Ambulatory Visit: Payer: Self-pay

## 2020-06-22 NOTE — Lactation Note (Signed)
This note was copied from a baby's chart. Lactation Consultation Note  Patient Name: QUINTA EIMER Weisinger WLSLH'T Date: 06/22/2020 Reason for consult: Follow-up assessment;Mother's request;Primapara;1st time breastfeeding;Multiple gestation;Late-preterm 34-36.6wks;Infant < 6lbs Age:25 wk.o.  NICU RN called and requested LC assistance, mom had questions. Mom's main concern right now is nipple pain, she voiced she's been exchanging flanges # 24 and # 27 and her nipples start getting very sore today. Mom experiencing pain after pumping, noticed bot nipples are reddened (no shine, flaking of white spots) and they have a crease on the nipple shaft.  LC got mom another set of flanges # 27 and asked her to stop using the # 24. She' still using coconut oil prior pumping; she said it helps "some" with her pain. Reviewed possible causes of nipple pain, pumping technique, pumping schedule and breast care.  Feeding plan:  1. Encouraged mom to continue pumping every 3 hours. She also told LC she would like to start working on increasing her supply. Mom will start power pumping during her first morning session 2. She'll start using her flanges # 27 consistently when pumping 3. Mom will schedule appt with OB if pain doesn't improve after switching flanges  No support person other than mom in the room at the time of [facility] consultation. Mom reported all questions and concerns were answered, she's aware of LC OP services and will call PRN.  Maternal Data    Feeding Mother's Current Feeding Choice: Breast Milk Nipple Type: Dr. Levert Feinstein Preemie  Lactation Tools Discussed/Used Tools: Pump;Flanges Flange Size: 27 Breast pump type: Double-Electric Breast Pump Pump Education: Setup, frequency, and cleaning;Milk Storage Reason for Pumping: LPI twins in NICU < 6 lbs Pumping frequency: 7-8 times/24 hours Pumped volume: 150 mL  Interventions Interventions: Breast feeding basics reviewed;DEBP;Coconut  oil  Discharge Pump: DEBP  Consult Status Consult Status: Follow-up Date: 06/25/20 Follow-up type: In-patient    Essica Kiker Venetia Constable 06/22/2020, 6:22 PM

## 2020-06-24 ENCOUNTER — Ambulatory Visit: Payer: Self-pay | Admitting: Clinical

## 2020-06-24 DIAGNOSIS — Z91199 Patient's noncompliance with other medical treatment and regimen due to unspecified reason: Secondary | ICD-10-CM

## 2020-06-24 DIAGNOSIS — Z5329 Procedure and treatment not carried out because of patient's decision for other reasons: Secondary | ICD-10-CM

## 2020-06-25 ENCOUNTER — Ambulatory Visit: Payer: Self-pay

## 2020-06-25 NOTE — Lactation Note (Signed)
This note was copied from a baby's chart. Lactation Consultation Note LC to room to assist with bf'ing. Infant sleepy and agitated with continued efforts. Reviewed normalcy. Will return to further assist tomorrow at 9am. Patient Name: Cathy Roman [place] [id number] Date: 06/25/2020 Reason for consult: Follow-up assessment;Other (Comment) (latch assist) Age:25 wk.o.  Maternal Data  milk supply wnl  LATCH Score Latch: Too sleepy or reluctant, no latch achieved, no sucking elicited.  Audible Swallowing: None  Type of Nipple: Everted at rest and after stimulation  Comfort (Breast/Nipple): Soft / non-tender  Hold (Positioning): Assistance needed to correctly position infant at breast and maintain latch.  LATCH Score: 5   Consult Status Consult Status: Follow-up Date: 06/26/20 (0900 feeding) Follow-up type: In-patient  Elder Negus, MA IBCLC 06/25/2020, 4:35 PM

## 2020-06-25 NOTE — Lactation Note (Signed)
This note was copied from a baby's chart. Lactation Consultation Note LC to room for bf'ing assistance. Infant too sleepy. Reviewed normalcy. Will plan return visit tomorrow at 1100 to further assist.   Patient Name: Cathy Roman Date: 06/25/2020 Reason for consult: Follow-up assessment;Other (Comment) (bf'ing assistance) Age:25 wk.o.  Maternal Data  milk supply wnl    LATCH Score Latch: Too sleepy or reluctant, no latch achieved, no sucking elicited.  Audible Swallowing: None  Type of Nipple: Everted at rest and after stimulation  Comfort (Breast/Nipple): Soft / non-tender  Hold (Positioning): Assistance needed to correctly position infant at breast and maintain latch.  LATCH Score: 5  Consult Status: Follow-up Date: 06/26/20 (1100) Follow-up type: In-patient   Elder Negus, MA IBCLC 06/25/2020, 4:38 PM

## 2020-06-26 ENCOUNTER — Ambulatory Visit: Payer: Self-pay

## 2020-06-26 NOTE — Lactation Note (Addendum)
This note was copied from a baby's chart. Lactation Consultation Note Infant did not wake for breastfeeding. Will return to further assist tomorrow at 1100. Patient Name: Cathy Roman Date: 06/26/2020 Reason for consult: Other (Comment) (breastfeeding assessment) Age:25 wk.o.  Maternal Data  milk supply is wnl   Feeding Mother's Current Feeding Choice: Breast Milk   Interventions  20mm shield    Consult Status Consult Status: Follow-up Date: 06/27/20 (1100) Follow-up type: In-patient   Elder Negus, MA IBCLC 06/26/2020, 1:55 PM

## 2020-06-26 NOTE — Lactation Note (Addendum)
This note was copied from a baby's chart. Lactation Consultation Note Observed a few short suckling bursts. No sustained latch.   Patient Name: Cathy Roman Manders [id number]'[id number] Date: 06/26/2020 Reason for consult: Other (Comment) (breastfeeding assessment) Age:25 wk.o.  Maternal Data  milk supply is wnl   Feeding Mother's Current Feeding Choice: Breast Milk   Lactation Tools Discussed/Used  53mm shield  Interventions  reviewed normalcy of feeding behaviors    Consult Status Consult Status: Follow-up Date: 06/27/20 Follow-up type: In-patient   Elder Negus, MA IBCLC 06/26/2020, 1:52 PM

## 2020-06-27 ENCOUNTER — Ambulatory Visit: Payer: Self-pay

## 2020-06-27 NOTE — Lactation Note (Signed)
This note was copied from a baby's chart. Lactation Consultation Note  Patient Name: Cathy Roman Crispo [id number]'U Date: 06/27/2020 Reason for consult: Follow-up assessment;Primapara;1st time breastfeeding;Multiple gestation;Infant < 6lbs;Late-preterm 34-36.6wks;NICU baby Age:26 wk.o.  LC in to assist with [name] positioning and latching to the breast.  Mom has an abundant milk supply and baby is ad lib feeding.  He woke and started cueing.  Mom placed baby STS in football hold on left breast.  Baby latched well after a couple attempts and guiding Mom's hand back away from areola so baby can latch deeper.   Baby fed with consistent sucking and swallowing until he stopped on his own.   [name] met with SLP for a bottle feeding evaluation.  Baby had a period of stridor over night needing some supplemental O2.  Elijah may be discharged tomorrow.  Mom desire OP lactation follow-up.  LC to see baby on discharge will send an OP message and request a referral from NNP.    Feeding Nipple Type: Dr. Myra Gianotti Preemie  LATCH Score Latch: Grasps breast easily, tongue down, lips flanged, rhythmical sucking.  Audible Swallowing: Spontaneous and intermittent  Type of Nipple: Everted at rest and after stimulation  Comfort (Breast/Nipple): Soft / non-tender  Hold (Positioning): Assistance needed to correctly position infant at breast and maintain latch.  LATCH Score: 9   Lactation Tools Discussed/Used Tools: Pump;Bottle Breast pump type: Double-Electric Breast Pump Pumping frequency: 6-8 times per 24 hrs Pumped volume: 120 mL  Interventions Interventions: Breast feeding basics reviewed;Assisted with latch;Skin to skin;Breast massage;Hand express;Breast compression;Adjust position;Support pillows;Position options;DEBP;Education  Discharge Discharge Education: Outpatient recommendation  Consult Status Consult Status: Follow-up Date: 06/28/20 Follow-up type:  In-patient    [name] 06/27/2020, 12:24 PM

## 2020-06-28 ENCOUNTER — Ambulatory Visit: Payer: Self-pay

## 2020-06-28 NOTE — Lactation Note (Signed)
This note was copied from a baby's chart. Lactation Consultation Note  Patient Name: KENISHIA PLACK Bentson EOFHQ'R Date: 06/28/2020   Age:25 wk.o.  LC in room to visit family but they were out at the time.  RN will notify LC when family is in the room later today.    Maternal Data    Feeding    LATCH Score                    Lactation Tools Discussed/Used    Interventions    Discharge    Consult Status      Maryruth Hancock [facility] 06/28/2020, 9:11 AM

## 2020-06-28 NOTE — Lactation Note (Signed)
This note was copied from a baby's chart. Lactation Consultation Note  Patient Name: Cathy Roman TXMIW'O Date: 06/28/2020 Reason for consult: Follow-up assessment;NICU baby Age:25 wk.o.  Follow up visit to 5 w.o twin [facility]) prior to discharge. Mother states she has been pumping and collecting ~151mL of EBM per pumping. Mother reports infant has been latching well but she does not want to stress him too much. Reinforced pumping, milk storage and care of parts. Talked about thawing and feeding stored breast milk.  BoyA: Infant is showing hunger cues at this time and mother is preparing to bottlefeed.  GirlB: Infant is sleeping in basinet during visit. No additional questions at this time. Reviewed resource available and encouraged to contact [facility] for questions or concerns.     Feeding Mother's Current Feeding Choice: Breast Milk Nipple Type: Dr. Levert Feinstein Preemie  Interventions Interventions: Breast feeding basics reviewed;Expressed milk;DEBP;Education  Discharge Discharge Education: Engorgement and breast care;Warning signs for feeding baby  Consult Status Consult Status: Complete Date: 06/28/20 Follow-up type: Call as needed    Cathy Roman 06/28/2020, 2:47 PM

## 2020-07-04 ENCOUNTER — Telehealth: Payer: Self-pay | Admitting: Clinical

## 2020-07-04 NOTE — Telephone Encounter (Signed)
Attempt to contact pt to reschedule her virtual consult with Behavioral Health Clinician; unable to leave voicemail as phone had busy signal; will send MyChart message to pt.

## 2020-07-16 ENCOUNTER — Telehealth (INDEPENDENT_AMBULATORY_CARE_PROVIDER_SITE_OTHER): Payer: Medicaid Other

## 2020-07-16 DIAGNOSIS — Z0130 Encounter for examination of blood pressure without abnormal findings: Secondary | ICD-10-CM

## 2020-07-16 NOTE — Progress Notes (Signed)
Blood Pressure Check  Called pt; busy signal. Called pt contact who indentifies herself as patient's mother. States the patient does not have phone service right now and does not have access to a phone at home while mother is at work. Pt's mother states she will text the patient and ask her to check her MyChart messages. MyChart message sent to patient to follow up.   Fleet Contras RN 07/16/20

## 2020-07-31 ENCOUNTER — Telehealth: Payer: Self-pay

## 2020-07-31 NOTE — Telephone Encounter (Signed)
Called patient; busy signal. Called pt's mother; VM left stating I am calling for Cathy Roman and requested she return my call. Call back number given.

## 2020-10-08 DIAGNOSIS — F331 Major depressive disorder, recurrent, moderate: Secondary | ICD-10-CM | POA: Diagnosis not present

## 2020-10-08 DIAGNOSIS — F900 Attention-deficit hyperactivity disorder, predominantly inattentive type: Secondary | ICD-10-CM | POA: Diagnosis not present

## 2020-10-15 ENCOUNTER — Telehealth: Payer: Self-pay

## 2020-10-15 NOTE — Telephone Encounter (Signed)
Pt calling to request RTW note. Pt had c-section on 5/3 for twins. Pt is planning to return to work on 10/28/20. Note given. Pt verbalized understanding.   Judeth Cornfield, RN

## 2020-11-30 DIAGNOSIS — R300 Dysuria: Secondary | ICD-10-CM | POA: Diagnosis not present

## 2020-11-30 DIAGNOSIS — N3091 Cystitis, unspecified with hematuria: Secondary | ICD-10-CM | POA: Diagnosis not present

## 2020-11-30 DIAGNOSIS — R1084 Generalized abdominal pain: Secondary | ICD-10-CM | POA: Diagnosis not present

## 2021-01-13 ENCOUNTER — Encounter (HOSPITAL_BASED_OUTPATIENT_CLINIC_OR_DEPARTMENT_OTHER): Payer: Self-pay | Admitting: *Deleted

## 2021-01-13 ENCOUNTER — Emergency Department (HOSPITAL_BASED_OUTPATIENT_CLINIC_OR_DEPARTMENT_OTHER): Payer: BLUE CROSS/BLUE SHIELD

## 2021-01-13 ENCOUNTER — Other Ambulatory Visit: Payer: Self-pay

## 2021-01-13 ENCOUNTER — Inpatient Hospital Stay (HOSPITAL_BASED_OUTPATIENT_CLINIC_OR_DEPARTMENT_OTHER)
Admission: EM | Admit: 2021-01-13 | Discharge: 2021-01-14 | Disposition: A | Discharge status: Other Acute Inpatient Hospital | Payer: BLUE CROSS/BLUE SHIELD | Attending: Obstetrics & Gynecology | Admitting: Obstetrics & Gynecology

## 2021-01-13 DIAGNOSIS — Z87891 Personal history of nicotine dependence: Secondary | ICD-10-CM | POA: Diagnosis not present

## 2021-01-13 DIAGNOSIS — Z79899 Other long term (current) drug therapy: Secondary | ICD-10-CM | POA: Diagnosis not present

## 2021-01-13 DIAGNOSIS — R109 Unspecified abdominal pain: Secondary | ICD-10-CM | POA: Diagnosis not present

## 2021-01-13 DIAGNOSIS — Z20822 Contact with and (suspected) exposure to covid-19: Secondary | ICD-10-CM | POA: Insufficient documentation

## 2021-01-13 DIAGNOSIS — O3680X0 Pregnancy with inconclusive fetal viability, not applicable or unspecified: Secondary | ICD-10-CM

## 2021-01-13 DIAGNOSIS — O00101 Right tubal pregnancy without intrauterine pregnancy: Secondary | ICD-10-CM | POA: Diagnosis not present

## 2021-01-13 LAB — URINALYSIS, ROUTINE W REFLEX MICROSCOPIC
Bilirubin Urine: NEGATIVE
Glucose, UA: NEGATIVE mg/dL
Ketones, ur: NEGATIVE mg/dL
Leukocytes,Ua: NEGATIVE
Nitrite: NEGATIVE
Protein, ur: NEGATIVE mg/dL
Specific Gravity, Urine: >1.03 — ABNORMAL HIGH (ref 1.005–1.030)
pH: 6 (ref 5.0–8.0)

## 2021-01-13 LAB — PREGNANCY, URINE: Preg Test, Ur: POSITIVE — AB

## 2021-01-13 LAB — CBC
HCT: 36.6 % (ref 36.0–46.0)
Hemoglobin: 11.9 g/dL — ABNORMAL LOW (ref 12.0–15.0)
MCH: 27.4 pg (ref 26.0–34.0)
MCHC: 32.5 g/dL (ref 30.0–36.0)
MCV: 84.3 fL (ref 80.0–100.0)
Platelets: 266 10*3/uL (ref 150–400)
RBC: 4.34 MIL/uL (ref 3.87–5.11)
RDW: 14.7 % (ref 11.5–15.5)
WBC: 9.1 10*3/uL (ref 4.0–10.5)
nRBC: 0 % (ref 0.0–0.2)

## 2021-01-13 LAB — COMPREHENSIVE METABOLIC PANEL
ALT: 19 U/L (ref 0–44)
AST: 17 U/L (ref 15–41)
Albumin: 4.5 g/dL (ref 3.5–5.0)
Alkaline Phosphatase: 53 U/L (ref 38–126)
Anion gap: 10 (ref 5–15)
BUN: 18 mg/dL (ref 6–20)
CO2: 24 mmol/L (ref 22–32)
Calcium: 9.5 mg/dL (ref 8.9–10.3)
Chloride: 101 mmol/L (ref 98–111)
Creatinine, Ser: 0.53 mg/dL (ref 0.44–1.00)
GFR, Estimated: >60 mL/min (ref >60)
Glucose, Bld: 98 mg/dL (ref 70–99)
Potassium: 4.3 mmol/L (ref 3.5–5.1)
Sodium: 135 mmol/L (ref 135–145)
Total Bilirubin: 0.7 mg/dL (ref 0.3–1.2)
Total Protein: 7.7 g/dL (ref 6.5–8.1)

## 2021-01-13 LAB — URINALYSIS, MICROSCOPIC (REFLEX)

## 2021-01-13 LAB — RESP PANEL BY RT-PCR (FLU A&B, COVID) ARPGX2
Influenza A by PCR: NEGATIVE
Influenza B by PCR: NEGATIVE
SARS Coronavirus 2 by RT PCR: NEGATIVE

## 2021-01-13 LAB — HCG, QUANTITATIVE, PREGNANCY: hCG, Beta Chain, Quant, S: 8709 m[IU]/mL — ABNORMAL HIGH (ref <5)

## 2021-01-13 LAB — LIPASE, BLOOD: Lipase: 34 U/L (ref 11–51)

## 2021-01-13 MED ORDER — ONDANSETRON HCL 4 MG/2ML IJ SOLN
4.0000 mg | Freq: Once | INTRAMUSCULAR | Status: AC
  Administered 2021-01-13: 22:00:00 4 mg via INTRAVENOUS
  Filled 2021-01-13: qty 2

## 2021-01-13 MED ORDER — MORPHINE SULFATE (PF) 2 MG/ML IV SOLN
2.0000 mg | Freq: Once | INTRAVENOUS | Status: AC
  Administered 2021-01-13: 22:00:00 2 mg via INTRAVENOUS
  Filled 2021-01-13: qty 1

## 2021-01-13 MED ORDER — SODIUM CHLORIDE 0.9 % IV BOLUS
1000.0000 mL | Freq: Once | INTRAVENOUS | Status: AC
  Administered 2021-01-13: 22:00:00 1000 mL via INTRAVENOUS

## 2021-01-13 NOTE — ED Notes (Signed)
Pt placed in gown.

## 2021-01-13 NOTE — MAU Note (Signed)
Reports abdominal pain that began on Saturday and feels constant and like some one is  Was given morphine at [facility] but it did not help.  Pain score: 6/10 LMP: irregular bleeding

## 2021-01-13 NOTE — MAU Provider Note (Signed)
History     CSN: [id number]  Arrival date and time: 01/13/21 1831   None     Chief Complaint  Patient presents with   Abdominal Pain   HPI [id number] Patient presented to [facility] today with 2 days of generalized abdominal pain that started 12/24 and was accompanied by nausea, vomiting and diarrhea. Now has only epigastric pain.She has had vaginal bleeding most days since 12/5 and has some bleeding today. ED sent her to MAU due to positive pregnancy test, HCG 8700 and no IUP on [place], evaluate for possible ruptured ectopic pregnancy.  Patient's last menstrual period was 11/19/2020.   Pertinent Gynecological History: Menses:  DUB Bleeding: dysfunctional uterine bleeding Contraception: none  Past Medical History:  Diagnosis Date   ADHD    ADD   Anxiety disorder    Chronic migraine without aura, intractable, without status migrainosus    Depression    Former cigarette smoker    Psychiatric disorder     Past Surgical History:  Procedure Laterality Date   CESAREAN SECTION  05/21/2020   Procedure: CESAREAN SECTION;  Surgeon: Warden Fillers, MD;  Location: [facility] LD ORS;  Service: Obstetrics;;   NONE      Family History  Problem Relation Age of Onset   Mental illness Neg Hx     Social History   Tobacco Use   Smoking status: Former   Smokeless tobacco: Never  Building services engineer Use: Never used  Substance Use Topics   Alcohol use: No   Drug use: No    Allergies:  Allergies  Allergen Reactions   Sulfa Antibiotics Hives    Medications Prior to Admission  Medication Sig Dispense Refill Last Dose   acetaminophen (TYLENOL) 500 MG tablet Take 500 mg by mouth every 6 (six) hours as needed for mild pain.   01/13/2021   buPROPion (WELLBUTRIN XL) 150 MG 24 hr tablet Take 150 mg by mouth daily.   01/12/2021   FLUoxetine (PROZAC) 40 MG capsule Take 40 mg by mouth daily.   01/12/2021   methylphenidate 54 MG PO CR tablet Take 108 mg by mouth every morning.   01/12/2021    cefadroxil (DURICEF) 500 MG capsule Take 1,000 mg by mouth 2 (two) times daily.      docusate sodium (COLACE) 100 MG capsule Take 1 capsule (100 mg total) by mouth 2 (two) times daily as needed for mild constipation or moderate constipation. 30 capsule 2    escitalopram (LEXAPRO) 10 MG tablet Take 10 mg by mouth daily. (Patient not taking: Reported on 05/31/2020)      ibuprofen (ADVIL) 600 MG tablet Take 1 tablet (600 mg total) by mouth every 6 (six) hours as needed for headache, mild pain, moderate pain or cramping. 30 tablet 2    NIFEdipine (PROCARDIA XL) 30 MG 24 hr tablet Take 2 tablets (60 mg total) by mouth daily. 60 tablet 0    norethindrone (MICRONOR) 0.35 MG tablet Take 1 tablet (0.35 mg total) by mouth daily. (Patient not taking: Reported on 05/31/2020) 28 tablet 11    oxyCODONE (OXY IR/ROXICODONE) 5 MG immediate release tablet Take 1 tablet (5 mg total) by mouth every 6 (six) hours as needed for moderate pain, severe pain or breakthrough pain. 30 tablet 0    Prenatal Vit-Fe Fumarate-FA (MULTIVITAMIN-PRENATAL) 27-0.8 MG TABS tablet Take 1 tablet by mouth daily at 12 noon.       Review of Systems  Constitutional: Negative.   Respiratory: Negative.  Cardiovascular: Negative.   Gastrointestinal:  Positive for abdominal pain.  Genitourinary:  Positive for vaginal bleeding. Negative for pelvic pain.  Physical Exam   Blood pressure 115/73, pulse (!) 113, temperature 98.6 F (37 C), temperature source Oral, resp. rate 19, height 5\' 4"  (1.626 m), weight 101.3 kg, last menstrual period 11/19/2020, SpO2 98 %, currently breastfeeding.  Physical Exam Vitals and nursing note reviewed.  Constitutional:      Appearance: She is obese. She is not ill-appearing.  HENT:     Head: Normocephalic and atraumatic.  Cardiovascular:     Rate and Rhythm: Normal rate.  Pulmonary:     Effort: Pulmonary effort is normal.  Abdominal:     General: A surgical scar is present.     Palpations: Abdomen is  soft.     Tenderness: There is no abdominal tenderness.  Skin:    General: Skin is warm and dry.  Neurological:     General: No focal deficit present.     Mental Status: She is alert.    MAU Course  Procedures Narrative & Impression  CLINICAL DATA:  Vaginal bleeding in pregnancy. LMP: 11/19/2020 corresponding to an estimated gestational age of [redacted] weeks 6 days.   EXAM: OBSTETRIC <14 WK 13/01/2020 AND TRANSVAGINAL OB US   TECHNIQUE: Both transabdominal and transvaginal ultrasound examinations were performed for complete evaluation of the gestation as well as the maternal uterus, adnexal regions, and pelvic cul-de-sac. Transvaginal technique was performed to assess early pregnancy.   COMPARISON:  None.   FINDINGS: The uterus is anteverted. The uterus demonstrates a heterogeneous echotexture with findings suggestive of adenomyosis.   The endometrium measures 6 mm in thickness. No intrauterine pregnancy identified.   The left ovary appears unremarkable. The right ovary is suboptimally visualized due to surrounding echogenic content, likely clot.   There is a complex heterogeneous and predominantly echogenic collection around the right ovary which may represent blood products/clot. Clinical correlation is recommended to evaluate for possibility of a ruptured ectopic pregnancy.   IMPRESSION: 1. No intrauterine pregnancy identified. 2. Probable blood clot surrounding the right ovary. Clinical correlation is recommended to evaluate for possibility of a ruptured ectopic pregnancy.   These results were called by telephone at the time of interpretation on 01/13/2021 at 9:52 pm to provider 01/15/2021 , who verbally acknowledged these results.     Electronically Signed   By: Mertha Baars M.D.   On: 01/13/2021 21:53    HCG, Beta Chain, Quant, S hCG, quantitative, pregnancy Collected: 01/13/21 1907  Result status: Final  Resulting lab: [facility]   Reference range: <5 mIU/mL  Value: 8,709 High     MDM Reviewed labs and viewed 10-11-1969 images, moderate  Assessment and Plan  Pregnancy of unknown location with US findings not suspicious for ruptured ectopic pregnancy with no pelvic free fluid or conclusive mass. No IUP seen. Discussed evaluation with serial HCG to f/u in [facility] for stat HCG on 12/28. Her questions were answered and precautions.  1/29 01/13/2021, 11:53 PM

## 2021-01-13 NOTE — ED Provider Notes (Signed)
[place] EMERGENCY DEPARTMENT Provider Note   CSN: [id number] Arrival date & time: 01/13/21  1831     History Chief Complaint  Patient presents with   Abdominal Pain    Cathy Roman is a 25 y.o. female.  With past medical history of premature delivery complicated by sepsis in May 2022 who presents to the emergency department with abdominal pain.  Patient states that for at least 2 weeks she has had ongoing daily vaginal bleeding.  She states that she has been using 2 tampons a day.  She describes it as bright red blood.  Denies clots.  She states that as of 3 days ago she began having constant, twisting abdominal pain with "spurts of severe sharp pain."  She describes the pain as diffuse.  She denies nausea however she states she vomited 4-5 times and had an episode of diarrhea on Saturday.  She also endorses pain that radiated to her right shoulder on Saturday.  She denies any fevers, malodorous discharge or urinary symptoms.  Nothing makes the pain better or worse.  She states that her last menstrual period was in November for some time.  She is unsure exact date.  She states that is been at least a month since her last sexual intercourse.   Abdominal Pain Associated symptoms: diarrhea, vaginal bleeding and vomiting   Associated symptoms: no fever and no vaginal discharge       Past Medical History:  Diagnosis Date   ADHD    ADD   Anxiety disorder    Chronic migraine without aura, intractable, without status migrainosus    Depression    Former cigarette smoker    Psychiatric disorder     Patient Active Problem List   Diagnosis Date Noted   [redacted] weeks gestation of pregnancy 05/21/2020   Cesarean delivery delivered 05/21/2020   E coli bacteremia    BMI 40.0-44.9, adult ([place]) 05/20/2020   Obesity in pregnancy 05/20/2020   UTI in pregnancy 05/19/2020   Pyelonephritis affecting pregnancy in third trimester 05/19/2020   Elevated blood pressure affecting  pregnancy in third trimester, antepartum 05/19/2020   Dichorionic diamniotic twin pregnancy in third trimester 05/19/2020   Major depressive disorder, single episode, severe without psychotic features ([place]) 02/28/2014   MDD (major depressive disorder), single episode, severe , no psychosis ([place]) 02/28/2014    Past Surgical History:  Procedure Laterality Date   CESAREAN SECTION  05/21/2020   Procedure: CESAREAN SECTION;  Surgeon: Griffin Basil, MD;  Location: [facility] LD [facility]S;  Service: Obstetrics;;   NONE       OB History     Gravida  1   Para  1   Term      Preterm  1   AB      Living  2      SAB      IAB      Ectopic      Multiple  1   Live Births  2           Family History  Problem Relation Age of Onset   Mental illness Neg Hx     Social History   Tobacco Use   Smoking status: Former   Smokeless tobacco: Never  Scientific laboratory technician Use: Never used  Substance Use Topics   Alcohol use: No   Drug use: No    Home Medications Prior to Admission medications   Medication Sig Start Date End Date Taking? Authorizing Provider  acetaminophen (TYLENOL) 500 MG tablet Take 500 mg by mouth every 6 (six) hours as needed for mild pain.    [provider]  buPROPion (WELLBUTRIN XL) 150 MG 24 hr tablet Take 150 mg by mouth daily.    [provider]  cefadroxil (DURICEF) 500 MG capsule Take 1,000 mg by mouth 2 (two) times daily. 05/25/20   [provider]  docusate sodium (COLACE) 100 MG capsule Take 1 capsule (100 mg total) by mouth 2 (two) times daily as needed for mild constipation or moderate constipation. 05/24/20   Anyanwu, Jethro Bastos, MD  escitalopram (LEXAPRO) 10 MG tablet Take 10 mg by mouth daily. Patient not taking: Reported on 05/31/2020    [provider]  FLUoxetine (PROZAC) 40 MG capsule Take 40 mg by mouth daily.    [provider]  ibuprofen (ADVIL) 600 MG tablet Take 1 tablet (600 mg total) by mouth every 6 (six)  hours as needed for headache, mild pain, moderate pain or cramping. 05/24/20   Anyanwu, Jethro Bastos, MD  methylphenidate 54 MG PO CR tablet Take 108 mg by mouth every morning.    [provider]  NIFEdipine (PROCARDIA XL) 30 MG 24 hr tablet Take 2 tablets (60 mg total) by mouth daily. 05/31/20   Adam Phenix, MD  norethindrone (MICRONOR) 0.35 MG tablet Take 1 tablet (0.35 mg total) by mouth daily. Patient not taking: Reported on 05/31/2020 05/28/20   Marny Lowenstein, PA-C  oxyCODONE (OXY IR/ROXICODONE) 5 MG immediate release tablet Take 1 tablet (5 mg total) by mouth every 6 (six) hours as needed for moderate pain, severe pain or breakthrough pain. 05/24/20   Anyanwu, Jethro Bastos, MD  Prenatal Vit-Fe Fumarate-FA (MULTIVITAMIN-PRENATAL) 27-0.8 MG TABS tablet Take 1 tablet by mouth daily at 12 noon.    [provider]    Allergies    Sulfa antibiotics  Review of Systems   Review of Systems  Constitutional:  Negative for fever.  Gastrointestinal:  Positive for abdominal pain, diarrhea and vomiting.  Genitourinary:  Positive for pelvic pain and vaginal bleeding. Negative for vaginal discharge.  Neurological:  Negative for syncope.  All other systems reviewed and are negative.  Physical Exam Updated Vital Signs BP (!) 133/92    Pulse (!) 102    Temp 98.2 F (36.8 C) (Oral)    Resp 20    Ht 5\' 4"  (1.626 m)    Wt 101.3 kg    LMP 11/19/2020    SpO2 98%    BMI 38.33 kg/m   Physical Exam Vitals and nursing note reviewed.  Constitutional:      General: She is not in acute distress.    Appearance: Normal appearance. She is well-developed. She is ill-appearing. She is not toxic-appearing.  HENT:     Head: Normocephalic and atraumatic.  Eyes:     General: No scleral icterus. Cardiovascular:     Rate and Rhythm: Regular rhythm. Tachycardia present.     Heart sounds: Normal heart sounds. No murmur heard. Pulmonary:     Effort: Pulmonary effort is normal. No respiratory distress.      Breath sounds: Normal breath sounds.  Abdominal:     General: Abdomen is protuberant. Bowel sounds are decreased. There is no distension.     Palpations: Abdomen is soft.     Tenderness: There is generalized abdominal tenderness.     Hernia: No hernia is present.  Skin:    General: Skin is warm and dry.  Capillary Refill: Capillary refill takes less than 2 seconds.  Neurological:     General: No focal deficit present.     Mental Status: She is alert and oriented to person, place, and time.  Psychiatric:        Mood and Affect: Mood normal.        Behavior: Behavior normal.    ED Results / Procedures / Treatments   Labs (all labs ordered are listed, but only abnormal results are displayed) Labs Reviewed  CBC - Abnormal; Notable for the following components:      Result Value   Hemoglobin 11.9 (*)    All other components within normal limits  URINALYSIS, ROUTINE W REFLEX MICROSCOPIC - Abnormal; Notable for the following components:   Specific Gravity, Urine >1.030 (*)    Hgb urine dipstick SMALL (*)    All other components within normal limits  PREGNANCY, URINE - Abnormal; Notable for the following components:   Preg Test, Ur POSITIVE (*)    All other components within normal limits  URINALYSIS, MICROSCOPIC (REFLEX) - Abnormal; Notable for the following components:   Bacteria, UA FEW (*)    All other components within normal limits  HCG, QUANTITATIVE, PREGNANCY - Abnormal; Notable for the following components:   hCG, Beta Chain, Quant, S 8,709 (*)    All other components within normal limits  RESP PANEL BY RT-PCR (FLU A&B, COVID) ARPGX2  LIPASE, BLOOD  COMPREHENSIVE METABOLIC PANEL   EKG None  Radiology US OB Comp < 14 Wks  Result Date: 01/13/2021 CLINICAL DATA:  Vaginal bleeding in pregnancy. LMP: 11/19/2020 corresponding to an estimated gestational age of [redacted] weeks 6 days. EXAM: OBSTETRIC <14 WK [place] AND TRANSVAGINAL OB US TECHNIQUE: Both transabdominal and  transvaginal ultrasound examinations were performed for complete evaluation of the gestation as well as the maternal uterus, adnexal regions, and pelvic cul-de-sac. Transvaginal technique was performed to assess early pregnancy. COMPARISON:  None. FINDINGS: The uterus is anteverted. The uterus demonstrates a heterogeneous echotexture with findings suggestive of adenomyosis. The endometrium measures 6 mm in thickness. No intrauterine pregnancy identified. The left ovary appears unremarkable. The right ovary is suboptimally visualized due to surrounding echogenic content, likely clot. There is a complex heterogeneous and predominantly echogenic collection around the right ovary which may represent blood products/clot. Clinical correlation is recommended to evaluate for possibility of a ruptured ectopic pregnancy. IMPRESSION: 1. No intrauterine pregnancy identified. 2. Probable blood clot surrounding the right ovary. Clinical correlation is recommended to evaluate for possibility of a ruptured ectopic pregnancy. These results were called by telephone at the time of interpretation on 01/13/2021 at 9:52 pm to provider Theodis Blaze , who verbally acknowledged these results. Electronically Signed   By: Anner Crete M.D.   On: 01/13/2021 21:53    Procedures Procedures   Medications Ordered in ED Medications  sodium chloride 0.9 % bolus 1,000 mL (1,000 mLs Intravenous New Bag/Given 01/13/21 2211)  ondansetron (ZOFRAN) injection 4 mg (4 mg Intravenous Given 01/13/21 2224)  morphine 2 MG/ML injection 2 mg (2 mg Intravenous Given 01/13/21 2224)   ED Course  I have reviewed the triage vital signs and the nursing notes.  Pertinent labs & imaging results that were available during my care of the patient were reviewed by me and considered in my medical decision making (see chart for details).    MDM Rules/Calculators/A&P 25 year old female who presents to the emergency department with abdominal pain and  vaginal bleeding.  Initial concern for ectopic pregnancy. Urine  pregnant test positive--> hCG quant 8709. Hemoglobin 11.9, similar to previous, platelets 266 UA without evidence of UTI Tachycardia to 110s, patient states is normal for her, however concern for tachycardia related to pain versus ectopic. Abdomen is diffusely tender to light palpation.  Abdomen is not rigid. Ultrasound upper abdomen concerning for blood clot surrounding the right ovary and possible ruptured ectopic pregnancy.  No intrauterine pregnancy  1 L IV fluids initiated, Zofran and morphine for pain. Patient last document Rh factor A POS from prior pregnancy. Will not administer RhoGAM at this time.  2159: Spoke with Dr. Roselie Awkward, OB/GYN who would like the patient to be transferred to MAU at this time for further assessment.  Final Clinical Impression(s) / ED Diagnoses Final diagnoses:  Right tubal pregnancy without intrauterine pregnancy    Rx / DC Orders ED Discharge Orders     None        Mickie Hillier, PA-C 01/13/21 2353    Godfrey Pick, MD 01/15/21 [phone]

## 2021-01-13 NOTE — ED Notes (Signed)
Care link her to transport patient.

## 2021-01-13 NOTE — ED Notes (Signed)
Report given to Governor Specking at MAU.

## 2021-01-13 NOTE — ED Triage Notes (Signed)
Abdominal pain and vaginal bleeding x 3 days. Vomiting and diarrhea.

## 2021-01-14 DIAGNOSIS — O00101 Right tubal pregnancy without intrauterine pregnancy: Secondary | ICD-10-CM | POA: Diagnosis not present

## 2021-01-14 DIAGNOSIS — O3680X0 Pregnancy with inconclusive fetal viability, not applicable or unspecified: Secondary | ICD-10-CM

## 2021-01-15 ENCOUNTER — Ambulatory Visit: Payer: Medicaid Other

## 2021-01-15 ENCOUNTER — Other Ambulatory Visit: Payer: Self-pay

## 2021-01-15 ENCOUNTER — Ambulatory Visit (INDEPENDENT_AMBULATORY_CARE_PROVIDER_SITE_OTHER): Payer: Medicaid Other | Admitting: Family Medicine

## 2021-01-15 ENCOUNTER — Encounter: Payer: Self-pay | Admitting: Family Medicine

## 2021-01-15 VITALS — BP 136/86 | HR 117 | Temp 98.5°F | Wt 228.4 lb

## 2021-01-15 DIAGNOSIS — O3680X0 Pregnancy with inconclusive fetal viability, not applicable or unspecified: Secondary | ICD-10-CM | POA: Diagnosis not present

## 2021-01-15 LAB — BETA HCG QUANT (REF LAB): hCG Quant: 5620 m[IU]/mL

## 2021-01-15 NOTE — Progress Notes (Signed)
Chart reviewed for nurse visit. Agree with plan of care.   Venora Maples, MD 01/15/21 4:38 PM

## 2021-01-15 NOTE — Progress Notes (Addendum)
Beta HCG Follow-up Visit  Cathy Roman presents to [facility] for follow-up beta HCG lab. She was seen in MAU for abdominal pain and vaginal bleeding on 01/13/21. Patient denies bleeding today; reports generalized lower abdominal and back pain. Discussed with patient that we are following beta HCG levels today. Results will be back in approximately 2 hours. Valid contact number for patient confirmed. I will call the patient with results. Ectopic precautions reviewed.  Beta HCG results: 01/13/21 @ 1907 8709  01/15/21 @ 1216 [id number]   Results and patient history reviewed with Crissie Reese, MD, who states patient should come into the office for further evaluation. States it is unclear if this is an ectopic or SAB. Patient called and informed for plan for follow-up. States she will come to appt this afternoon.   [name] 01/15/2021 12:04 PM

## 2021-01-15 NOTE — Assessment & Plan Note (Signed)
Bedside US done. No free fluid seen in pelvis or Morrison's pouch. BP stable, still tachycardic but apparently this is long standing. Abdominal exam with some mild tenderness but no signs of rebound/peritonitis. Overall reassuring findings, at this point most strongly suspect missed AB and possibly ruptured corpus luteum cyst as etiology of her symptoms. Will recheck CBC to ensure no drop in hgb. Reviewed warning signs in detail with patient. Will have her return in 2 days for hcg, if ongoing downtrend then can go to weekly checks until normal. Patient interested in IUD, scheduled for next available in 02/2021.

## 2021-01-15 NOTE — Progress Notes (Signed)
GYNECOLOGY OFFICE VISIT NOTE  History:   Cathy Roman is a 25 y.o. [phone] here today for MAU follow up.  Patient seen in MAU on 01/13/2021 At that time hcg was 8,709 US showed now definite IUP and possible ruptured ovarian ectopic but no free fluid in the pelvis Stable labs, was set up for follow up today Earlier today with nurse reported ongoing diffuse abdominal pain and was tachycardic to 117 Repeat hcg earlier today was 5,620 Patient asked to return for provider eval  To me patient reports ongoing abdominal pain like a band across her entire lower abdomen Somewhat worse R>L Has been taking ibuprofen and tylenol without any significant relief Also received morphine in MAU but reports it was also not effective Minimal vaginal bleeding today Thinks she did have an episode of much heavier bleeding prior to presenting to MAU Reports she has had a workup for tachycardia with a cardiologist and was told everything was normal  Health Maintenance Due  Topic Date Due   COVID-19 Vaccine (1) Never done   HPV VACCINES (1 - 2-dose series) Never done   HIV Screening  Never done   Hepatitis C Screening  Never done   PAP-Cervical Cytology Screening  Never done   PAP SMEAR-Modifier  Never done   INFLUENZA VACCINE  Never done    Past Medical History:  Diagnosis Date   ADHD    ADD   Anxiety disorder    Chronic migraine without aura, intractable, without status migrainosus    Depression    Former cigarette smoker    Psychiatric disorder     Past Surgical History:  Procedure Laterality Date   CESAREAN SECTION  05/21/2020   Procedure: CESAREAN SECTION;  Surgeon: Warden Fillers, MD;  Location: [facility] LD ORS;  Service: Obstetrics;;   NONE      The following portions of the patient's history were reviewed and updated as appropriate: allergies, current medications, past family history, past medical history, past social history, past surgical history and problem list.   Health  Maintenance:   Last pap: No results found for: DIAGPAP, HPV, HPVHIGH NILM 01/09/2020 in Care Everywhere  Last mammogram:  N/a    Review of Systems:  Pertinent items noted in HPI and remainder of comprehensive ROS otherwise negative.  Physical Exam:  BP 120/84    Pulse (!) 120    LMP 11/19/2020    SpO2 98%  CONSTITUTIONAL: Well-developed, well-nourished female in no acute distress.  HEENT:  Normocephalic, atraumatic. External right and left ear normal. No scleral icterus.  NECK: Normal range of motion, supple, no masses noted on observation SKIN: No rash noted. Not diaphoretic. No erythema. No pallor. MUSCULOSKELETAL: Normal range of motion. No edema noted. NEUROLOGIC: Alert and oriented to person, place, and time. Normal muscle tone coordination.  PSYCHIATRIC: Normal mood and affect. Normal behavior. Normal judgment and thought content. RESPIRATORY: Effort normal, no problems with respiration noted ABDOMEN: No masses noted. No other overt distention noted.  Tender to palpation in lower quadrants, R>L PELVIC: Deferred  Labs and Imaging Results for orders placed or performed in visit on 01/15/21 (from the past 168 hour(s))  Beta hCG quant (ref lab)   Collection Time: 01/15/21 12:16 PM  Result Value Ref Range   hCG Quant 5,620 mIU/mL  Results for orders placed or performed during the hospital encounter of 01/13/21 (from the past 168 hour(s))  Urinalysis, Routine w reflex microscopic   Collection Time: 01/13/21  7:02 PM  Result Value  Ref Range   Color, Urine YELLOW YELLOW   APPearance CLEAR CLEAR   Specific Gravity, Urine >1.030 (H) 1.005 - 1.030   pH 6.0 5.0 - 8.0   Glucose, UA NEGATIVE NEGATIVE mg/dL   Hgb urine dipstick SMALL (A) NEGATIVE   Bilirubin Urine NEGATIVE NEGATIVE   Ketones, ur NEGATIVE NEGATIVE mg/dL   Protein, ur NEGATIVE NEGATIVE mg/dL   Nitrite NEGATIVE NEGATIVE   Leukocytes,Ua NEGATIVE NEGATIVE  Pregnancy, urine   Collection Time: 01/13/21  7:02 PM   Result Value Ref Range   Preg Test, Ur POSITIVE (A) NEGATIVE  Urinalysis, Microscopic (reflex)   Collection Time: 01/13/21  7:02 PM  Result Value Ref Range   RBC / HPF 0-5 0 - 5 RBC/hpf   WBC, UA 0-5 0 - 5 WBC/hpf   Bacteria, UA FEW (A) NONE SEEN   Squamous Epithelial / LPF 0-5 0 - 5   Mucus PRESENT    Ca Oxalate Crys, UA PRESENT   Lipase, blood   Collection Time: 01/13/21  7:07 PM  Result Value Ref Range   Lipase 34 11 - 51 U/L  Comprehensive metabolic panel   Collection Time: 01/13/21  7:07 PM  Result Value Ref Range   Sodium 135 135 - 145 mmol/L   Potassium 4.3 3.5 - 5.1 mmol/L   Chloride 101 98 - 111 mmol/L   CO2 24 22 - 32 mmol/L   Glucose, Bld 98 70 - 99 mg/dL   BUN 18 6 - 20 mg/dL   Creatinine, Ser 8.46 0.44 - 1.00 mg/dL   Calcium 9.5 8.9 - 65.9 mg/dL   Total Protein 7.7 6.5 - 8.1 g/dL   Albumin 4.5 3.5 - 5.0 g/dL   AST 17 15 - 41 U/L   ALT 19 0 - 44 U/L   Alkaline Phosphatase 53 38 - 126 U/L   Total Bilirubin 0.7 0.3 - 1.2 mg/dL   GFR, Estimated >93 >57 mL/min   Anion gap 10 5 - 15  CBC   Collection Time: 01/13/21  7:07 PM  Result Value Ref Range   WBC 9.1 4.0 - 10.5 K/uL   RBC 4.34 3.87 - 5.11 MIL/uL   Hemoglobin 11.9 (L) 12.0 - 15.0 g/dL   HCT 01.7 79.3 - 90.3 %   MCV 84.3 80.0 - 100.0 fL   MCH 27.4 26.0 - 34.0 pg   MCHC 32.5 30.0 - 36.0 g/dL   RDW 00.9 23.3 - 00.7 %   Platelets 266 150 - 400 K/uL   nRBC 0.0 0.0 - 0.2 %  hCG, quantitative, pregnancy   Collection Time: 01/13/21  7:07 PM  Result Value Ref Range   hCG, Beta Chain, Quant, S 8,709 (H) <5 mIU/mL  Resp Panel by RT-PCR (Flu A&B, Covid) Nasopharyngeal Swab   Collection Time: 01/13/21 10:31 PM   Specimen: Nasopharyngeal Swab; Nasopharyngeal(NP) swabs in vial transport medium  Result Value Ref Range   SARS Coronavirus 2 by RT PCR NEGATIVE NEGATIVE   Influenza A by PCR NEGATIVE NEGATIVE   Influenza B by PCR NEGATIVE NEGATIVE   US OB Comp < 14 Wks  Result Date: 01/13/2021 CLINICAL DATA:   Vaginal bleeding in pregnancy. LMP: 11/19/2020 corresponding to an estimated gestational age of [redacted] weeks 6 days. EXAM: OBSTETRIC <14 WK [place] AND TRANSVAGINAL OB US TECHNIQUE: Both transabdominal and transvaginal ultrasound examinations were performed for complete evaluation of the gestation as well as the maternal uterus, adnexal regions, and pelvic cul-de-sac. Transvaginal technique was performed to assess early pregnancy. COMPARISON:  None. FINDINGS: The uterus is anteverted. The uterus demonstrates a heterogeneous echotexture with findings suggestive of adenomyosis. The endometrium measures 6 mm in thickness. No intrauterine pregnancy identified. The left ovary appears unremarkable. The right ovary is suboptimally visualized due to surrounding echogenic content, likely clot. There is a complex heterogeneous and predominantly echogenic collection around the right ovary which may represent blood products/clot. Clinical correlation is recommended to evaluate for possibility of a ruptured ectopic pregnancy. IMPRESSION: 1. No intrauterine pregnancy identified. 2. Probable blood clot surrounding the right ovary. Clinical correlation is recommended to evaluate for possibility of a ruptured ectopic pregnancy. These results were called by telephone at the time of interpretation on 01/13/2021 at 9:52 pm to provider Mertha Baars , who verbally acknowledged these results. Electronically Signed   By: Elgie Collard M.D.   On: 01/13/2021 21:53      Assessment and Plan:   Problem List Items Addressed This Visit       Other   Pregnancy of unknown anatomic location    Bedside US done. No free fluid seen in pelvis or Morrison's pouch. BP stable, still tachycardic but apparently this is long standing. Abdominal exam with some mild tenderness but no signs of rebound/peritonitis. Overall reassuring findings, at this point most strongly suspect missed AB and possibly ruptured corpus luteum cyst as etiology of her symptoms. Will  recheck CBC to ensure no drop in hgb. Reviewed warning signs in detail with patient. Will have her return in 2 days for hcg, if ongoing downtrend then can go to weekly checks until normal. Patient interested in IUD, scheduled for next available in 02/2021.       Relevant Orders   CBC    Routine preventative health maintenance measures emphasized. Please refer to After Visit Summary for other counseling recommendations.   No follow-ups on file.    Total face-to-face time with patient: 20 minutes.  Over 50% of encounter was spent on counseling and coordination of care.   Venora Maples, MD/MPH Attending Family Medicine Physician, [facility] for [facility], [facility]

## 2021-01-16 LAB — CBC
Hematocrit: 37.5 % (ref 34.0–46.6)
Hemoglobin: 12.4 g/dL (ref 11.1–15.9)
MCH: 27.1 pg (ref 26.6–33.0)
MCHC: 33.1 g/dL (ref 31.5–35.7)
MCV: 82 fL (ref 79–97)
Platelets: 279 10*3/uL (ref 150–450)
RBC: 4.58 x10E6/uL (ref 3.77–5.28)
RDW: 14.8 % (ref 11.7–15.4)
WBC: 8.8 10*3/uL (ref 3.4–10.8)

## 2021-01-17 ENCOUNTER — Ambulatory Visit: Payer: Medicaid Other

## 2021-01-17 ENCOUNTER — Telehealth: Payer: Self-pay | Admitting: *Deleted

## 2021-01-17 NOTE — Telephone Encounter (Signed)
Called pt regarding missed appt today for stat BHCG.  She stated that she tested positive for Covid yesterday and that is why she did not come in.  Her Covid sx are cough and runny nose. She denies abdominal pain or heavy vaginal bleeding. Consult w/Dr. Crissie Reese who recommends pt to have appt for stat BHCG after Hanah Moultry #5 of her +Covid test. Appointment was given for 01/21/21 @ 1:15 pm.  Pt was instructed to go to ED if she develops shortness of breath. She should go to MAU of she develops heavy vaginal bleeding or severe abdominal/pelvic pain. Pt voiced understanding of all information and instructions given.

## 2021-01-22 ENCOUNTER — Ambulatory Visit (INDEPENDENT_AMBULATORY_CARE_PROVIDER_SITE_OTHER): Payer: Medicaid Other

## 2021-01-22 ENCOUNTER — Other Ambulatory Visit: Payer: Self-pay

## 2021-01-22 VITALS — BP 131/89 | HR 104 | Wt 231.3 lb

## 2021-01-22 DIAGNOSIS — O3680X0 Pregnancy with inconclusive fetal viability, not applicable or unspecified: Secondary | ICD-10-CM

## 2021-01-22 NOTE — Progress Notes (Signed)
Chart reviewed for nurse visit. Agree with plan of care.   Venora Maples, MD 01/22/21 4:41 PM

## 2021-01-22 NOTE — Progress Notes (Signed)
Beta HCG Follow-up Visit  Ludmila Ebarb Lunden presents to [facility] for follow-up beta HCG lab. She was seen in office on 01/15/21 for abdominal pain and vaginal bleeding during early pregnancy following visit at MAU. Pregnancy outcome determined to be a missed AB at that visit. Patient reports mild menstrual cramps today, rated 2/10 on pain scale. Reports vaginal bleeding like a period. Discussed with patient that we are following beta HCG levels today. Results will be back in approximately 24 hours. Pt will be contacted with results. Pt to return for IUD insertion on 03/04/21. Encouraged pt to use barrier method of contraception until that time.  [name] 01/22/2021 1:41 PM

## 2021-01-23 ENCOUNTER — Telehealth: Payer: Self-pay | Admitting: General Practice

## 2021-01-23 DIAGNOSIS — O039 Complete or unspecified spontaneous abortion without complication: Secondary | ICD-10-CM

## 2021-01-23 LAB — BETA HCG QUANT (REF LAB): hCG Quant: 3746 m[IU]/mL

## 2021-01-23 NOTE — Telephone Encounter (Signed)
Called patient and informed her of bhcg results and discussed recommended follow up lab 1-2 weeks prior to IUD insertion. Patient verbalized understanding. Lab appt scheduled for 2/2 @ 11am.

## 2021-02-20 ENCOUNTER — Other Ambulatory Visit: Payer: Medicaid Other

## 2021-02-20 ENCOUNTER — Other Ambulatory Visit: Payer: Self-pay

## 2021-02-20 DIAGNOSIS — O039 Complete or unspecified spontaneous abortion without complication: Secondary | ICD-10-CM

## 2021-02-21 LAB — BETA HCG QUANT (REF LAB): hCG Quant: 168 m[IU]/mL

## 2021-02-24 ENCOUNTER — Telehealth: Payer: Self-pay

## 2021-02-24 NOTE — Telephone Encounter (Addendum)
-----   Message from Clarnce Flock, MD sent at 02/21/2021  8:19 AM EST ----- Downtrending appropriately Clinical staff please coordinate follow up hcg in one week, trend to 0 Has an appt on 03/04/21 for birth control, we may want to push it back a week or two if she is able to   Called pt with results.

## 2021-02-26 ENCOUNTER — Other Ambulatory Visit: Payer: Self-pay

## 2021-02-26 DIAGNOSIS — O00101 Right tubal pregnancy without intrauterine pregnancy: Secondary | ICD-10-CM

## 2021-02-28 ENCOUNTER — Other Ambulatory Visit: Payer: Self-pay

## 2021-02-28 ENCOUNTER — Other Ambulatory Visit: Payer: Medicaid Other

## 2021-02-28 DIAGNOSIS — O00101 Right tubal pregnancy without intrauterine pregnancy: Secondary | ICD-10-CM

## 2021-03-01 LAB — BETA HCG QUANT (REF LAB): hCG Quant: 74 m[IU]/mL

## 2021-03-03 ENCOUNTER — Telehealth: Payer: Self-pay

## 2021-03-03 DIAGNOSIS — O00101 Right tubal pregnancy without intrauterine pregnancy: Secondary | ICD-10-CM

## 2021-03-03 NOTE — Telephone Encounter (Addendum)
-----   Message from Venora Maples, MD sent at 03/03/2021  9:27 AM EST ----- Downtrending appropriately Clinical staff please coordinate follow up hcg in one week, trend to 0   Called pt with new beta HCG result of 74. Lab appt scheduled for 03/11/21 at 11 AM.

## 2021-03-04 ENCOUNTER — Ambulatory Visit: Payer: Medicaid Other | Admitting: Family Medicine

## 2021-03-10 ENCOUNTER — Other Ambulatory Visit: Payer: Medicaid Other

## 2021-03-11 ENCOUNTER — Other Ambulatory Visit: Payer: Medicaid Other

## 2021-03-11 ENCOUNTER — Other Ambulatory Visit: Payer: Self-pay

## 2021-03-11 DIAGNOSIS — O00101 Right tubal pregnancy without intrauterine pregnancy: Secondary | ICD-10-CM

## 2021-03-11 NOTE — Addendum Note (Signed)
Addended by: [name] on: 03/11/2021 08:12 AM   Modules accepted: Orders

## 2021-03-12 LAB — BETA HCG QUANT (REF LAB): hCG Quant: 44 m[IU]/mL

## 2021-03-13 DIAGNOSIS — F900 Attention-deficit hyperactivity disorder, predominantly inattentive type: Secondary | ICD-10-CM | POA: Diagnosis not present

## 2021-03-13 DIAGNOSIS — F331 Major depressive disorder, recurrent, moderate: Secondary | ICD-10-CM | POA: Diagnosis not present

## 2021-03-18 ENCOUNTER — Encounter: Payer: Self-pay | Admitting: Family Medicine

## 2021-03-19 ENCOUNTER — Ambulatory Visit: Payer: Medicaid Other | Admitting: Family Medicine

## 2021-04-08 ENCOUNTER — Encounter: Payer: Self-pay | Admitting: Family Medicine

## 2021-04-08 ENCOUNTER — Telehealth: Payer: Self-pay | Admitting: *Deleted

## 2021-04-08 ENCOUNTER — Other Ambulatory Visit: Payer: Self-pay

## 2021-04-08 ENCOUNTER — Other Ambulatory Visit: Payer: Medicaid Other

## 2021-04-08 DIAGNOSIS — O039 Complete or unspecified spontaneous abortion without complication: Secondary | ICD-10-CM

## 2021-04-08 NOTE — Telephone Encounter (Signed)
Cathy Roman called front desk and states she has to talk with nurse . Cathy Roman had a missed ab in 01/15/21 and has had follow up bhcg's trending down, last was 44 on 2/ 21/23.  She has bhcg 04/22/21 scheduled. She reports she got what she thought was a period 03/10/21-03/15/21. States she hasn't gotten another period and that it should have started. She states she is paranoid and worried. She denies unprotected intercourse. I advised non stat bhcg and she agreed to 1:30 appointment today. ?Nancy Fetter ?

## 2021-04-09 ENCOUNTER — Telehealth: Payer: Self-pay

## 2021-04-09 ENCOUNTER — Encounter: Payer: Self-pay | Admitting: Family Medicine

## 2021-04-09 ENCOUNTER — Telehealth: Payer: Self-pay | Admitting: Lactation Services

## 2021-04-09 LAB — BETA HCG QUANT (REF LAB): hCG Quant: 14 m[IU]/mL

## 2021-04-09 NOTE — Telephone Encounter (Signed)
Opened in error

## 2021-04-09 NOTE — Telephone Encounter (Signed)
Cathy Maples, MD  [name], RN ?Almost there, given current rate of drop should be back to normal in two weeks. Please have patient come back for a (hopefully) final check of her HCG the week of April 3, and then we should be good to see her the following week for her already scheduled appt. Please give her a call and let her know the plan.  ? ?Called pt. Results and provider recommendation reviewed. Pt to return 04/22/21 for lab draw. Asked patient to be considering Liletta vs Paragard IUD.  ? ? ?

## 2021-04-10 ENCOUNTER — Telehealth: Payer: Self-pay | Admitting: General Practice

## 2021-04-10 NOTE — Telephone Encounter (Signed)
Called patient regarding mychart message. Patient states someone already called her yesterday & answered her questions.  ?

## 2021-04-21 ENCOUNTER — Other Ambulatory Visit: Payer: Self-pay

## 2021-04-21 DIAGNOSIS — O039 Complete or unspecified spontaneous abortion without complication: Secondary | ICD-10-CM

## 2021-04-22 ENCOUNTER — Other Ambulatory Visit: Payer: Medicaid Other

## 2021-04-22 DIAGNOSIS — O039 Complete or unspecified spontaneous abortion without complication: Secondary | ICD-10-CM

## 2021-04-23 LAB — BETA HCG QUANT (REF LAB): hCG Quant: 7 m[IU]/mL

## 2021-04-28 ENCOUNTER — Other Ambulatory Visit: Payer: Self-pay | Admitting: Family Medicine

## 2021-04-28 ENCOUNTER — Other Ambulatory Visit: Payer: Medicaid Other

## 2021-04-28 ENCOUNTER — Telehealth: Payer: Self-pay

## 2021-04-28 DIAGNOSIS — O3680X0 Pregnancy with inconclusive fetal viability, not applicable or unspecified: Secondary | ICD-10-CM

## 2021-04-28 NOTE — Telephone Encounter (Signed)
Called pt with beta HCG result from 04/22/21 of 7. Explained that per Crissie Reese, MD pt should return today for repeat beta HCG. If negative, pt will return for IUD insertion tomorrow.  ?

## 2021-04-29 ENCOUNTER — Ambulatory Visit (INDEPENDENT_AMBULATORY_CARE_PROVIDER_SITE_OTHER): Payer: BC Managed Care – PPO | Admitting: Family Medicine

## 2021-04-29 ENCOUNTER — Encounter: Payer: Self-pay | Admitting: Family Medicine

## 2021-04-29 VITALS — BP 133/95 | HR 120 | Ht 64.0 in | Wt 225.4 lb

## 2021-04-29 DIAGNOSIS — Z975 Presence of (intrauterine) contraceptive device: Secondary | ICD-10-CM | POA: Insufficient documentation

## 2021-04-29 DIAGNOSIS — Z3040 Encounter for surveillance of contraceptives, unspecified: Secondary | ICD-10-CM

## 2021-04-29 DIAGNOSIS — Z30430 Encounter for insertion of intrauterine contraceptive device: Secondary | ICD-10-CM | POA: Diagnosis not present

## 2021-04-29 LAB — POCT PREGNANCY, URINE: Preg Test, Ur: NEGATIVE

## 2021-04-29 LAB — BETA HCG QUANT (REF LAB): hCG Quant: 5 m[IU]/mL

## 2021-04-29 MED ORDER — LEVONORGESTREL 20.1 MCG/DAY IU IUD
1.0000 | INTRAUTERINE_SYSTEM | Freq: Once | INTRAUTERINE | Status: AC
  Administered 2021-04-29: 1 via INTRAUTERINE

## 2021-04-29 NOTE — Progress Notes (Signed)
? ? ?  GYNECOLOGY OFFICE PROCEDURE NOTE ? ?Cathy Roman is a 26 y.o. [phone] here for [place] IUD insertion. No GYN concerns.  Last pap smear: ? ?01/09/2020 - NILM (in care everywhere) ? ?HCG 5 yesterday ?UPT negative today ? ?IUD Insertion Procedure Note ?Patient identified, informed consent performed, consent signed.   Discussed risks of irregular bleeding, increased cramping, infection, malpositioning or misplacement of the IUD outside the uterus which may require further procedure such as laparoscopy. Also discussed >99% contraception efficacy, increased risk of ectopic pregnancy with failure of method.  Time out was performed. ? ?Speculum placed in the vagina with great difficulty due to spasm of the perineal muscles. Cervix anterior and anteverted with uterus retroverted on exam.  Cervix visualized.  Cleaned with Betadine x 2.  Grasped anteriorly with a single tooth tenaculum with one half in the cervical os. Cervical os unable to be sounded with EMB pipette so os dilator used x2 with good success. Subsequently uterus sounded to 9 cm, however while manipulating the cervix the tenaculum pulled through the cervix on the superior portion and began to bleed. The tenaculum was replaced, and the IUD placed per manufacturer's recommendations.  Strings trimmed to about 4-5 cm. Tenaculum was removed, and pressure was held on the bleeding area of the cervix until good hemostasis was noted.  Patient tolerated procedure well.  ? ?Patient was given post-procedure instructions.  She was advised to have backup contraception for one week.  Patient was also asked to check IUD strings periodically and follow up in 4 weeks for IUD check. ? ?Venora Maples, MD/MPH ?Attending Family Medicine Physician, Faculty Practice ?Center for [employer], [facility] ? ?

## 2021-05-01 DIAGNOSIS — J020 Streptococcal pharyngitis: Secondary | ICD-10-CM | POA: Diagnosis not present

## 2021-05-13 ENCOUNTER — Encounter: Payer: Self-pay | Admitting: Family Medicine

## 2021-05-14 ENCOUNTER — Telehealth: Payer: Self-pay | Admitting: General Practice

## 2021-05-14 NOTE — Telephone Encounter (Signed)
Called patient regarding IUD concern and she states she thinks it is okay now. Patient states she thinks she was just feeling the strings and not the device itself. Patient states she will follow up on the 10th at her appt. Patient had no other questions. ?

## 2021-05-28 ENCOUNTER — Encounter: Payer: Self-pay | Admitting: Family Medicine

## 2021-05-28 ENCOUNTER — Ambulatory Visit (INDEPENDENT_AMBULATORY_CARE_PROVIDER_SITE_OTHER): Payer: Medicaid Other | Admitting: Family Medicine

## 2021-05-28 DIAGNOSIS — R030 Elevated blood-pressure reading, without diagnosis of hypertension: Secondary | ICD-10-CM

## 2021-05-28 DIAGNOSIS — Z975 Presence of (intrauterine) contraceptive device: Secondary | ICD-10-CM

## 2021-05-28 HISTORY — DX: Elevated blood-pressure reading, without diagnosis of hypertension: R03.0

## 2021-05-28 NOTE — Assessment & Plan Note (Signed)
Elevated readings both at this visit and prior, no hx of this in the past. Recommended she follow up with PCP for possible initiation of treatment.  ?

## 2021-05-28 NOTE — Assessment & Plan Note (Signed)
Strings at expected length, IUD well positioned. If ongoing bleeding instructed [place] to call so we can troubleshoot, follow up PRN.  ?

## 2021-05-28 NOTE — Progress Notes (Signed)
? ?GYNECOLOGY OFFICE VISIT NOTE ? ?History:  ? Cathy Roman is a 26 y.o. [phone] here today for IUD check. ? ?After the longest hcg trend of my life patient finally had hcg of 5 on 04/28/2021 ?Had Liletta IUD placed on 04/29/2021 ? ?Reports she sneezed and a tampon fell out ?Felt something sharp and is worried it may have dislodged ?Had some bleeding which has now lightened to spotting ? ?Health Maintenance Due  ?Topic Date Due  ? COVID-19 Vaccine (1) Never done  ? HPV VACCINES (1 - 2-dose series) Never done  ? HIV Screening  Never done  ? Hepatitis C Screening  Never done  ? PAP-Cervical Cytology Screening  Never done  ? PAP SMEAR-Modifier  Never done  ? ? ?Past Medical History:  ?Diagnosis Date  ? ADHD   ? ADD  ? Anxiety disorder   ? Chronic migraine without aura, intractable, without status migrainosus   ? Depression   ? Former cigarette smoker   ? Psychiatric disorder   ? ? ?Past Surgical History:  ?Procedure Laterality Date  ? CESAREAN SECTION  05/21/2020  ? Procedure: CESAREAN SECTION;  Surgeon: Griffin Basil, MD;  Location: [facility] LD ORS;  Service: Obstetrics;;  ? NONE    ? ? ?The following portions of the patient's history were reviewed and updated as appropriate: allergies, current medications, past family history, past medical history, past social history, past surgical history and problem list.  ? ?Health Maintenance:   ?Last pap: ?No results found for: DIAGPAP, HPV, [place] ?01/09/2020 NILM (car eeverywhere) ? ?Last mammogram:  ?N/a  ? ? ?Review of Systems:  ?Pertinent items noted in HPI and remainder of comprehensive ROS otherwise negative. ? ?Physical Exam:  ?BP (!) 140/93   Pulse (!) 109   Wt 234 lb 6.4 oz (106.3 kg)   LMP 04/11/2021   BMI 40.23 kg/m?  ?CONSTITUTIONAL: Well-developed, well-nourished female in no acute distress.  ?HEENT:  Normocephalic, atraumatic. External right and left ear normal. No scleral icterus.  ?NECK: Normal range of motion, supple, no masses noted on observation ?SKIN: No  rash noted. Not diaphoretic. No erythema. No pallor. ?MUSCULOSKELETAL: Normal range of motion. No edema noted. ?NEUROLOGIC: Alert and oriented to person, place, and time. Normal muscle tone coordination.  ?PSYCHIATRIC: Normal mood and affect. Normal behavior. Normal judgment and thought content. ?RESPIRATORY: Effort normal, no problems with respiration noted ?ABDOMEN: No masses noted. No other overt distention noted.   ?PELVIC:  Again a very difficult pelvic exam, cervix ultimately visualized with strings again seen at 4-5cm length as I previously documented, small amount of old bloody discharge, no active bleeding from the os ? ?Labs and Imaging ?No results found for this or any previous visit (from the past 168 hour(s)). ?No results found.    ?Assessment and Plan:  ? ?Problem List Items Addressed This Visit   ? ?  ? Other  ? IUD (intrauterine device) in place  ?  Strings at expected length, IUD well positioned. If ongoing bleeding instructed [place] to call so we can troubleshoot, follow up PRN.  ? ?  ?  ? Elevated blood-pressure reading without diagnosis of hypertension  ?  Elevated readings both at this visit and prior, no hx of this in the past. Recommended she follow up with PCP for possible initiation of treatment.  ? ?  ?  ? ? ?Routine preventative health maintenance measures emphasized. ?Please refer to After Visit Summary for other counseling recommendations.  ? ?Return if symptoms  worsen or fail to improve.   ? ?Total face-to-face time with patient: 15 minutes.  Over 50% of encounter was spent on counseling and coordination of care. ? ? ?Clarnce Flock, MD/MPH ?Attending Family Medicine Physician, Faculty Practice ?Center for [facility] ? ?

## 2021-07-14 DIAGNOSIS — R0981 Nasal congestion: Secondary | ICD-10-CM | POA: Diagnosis not present

## 2021-07-14 DIAGNOSIS — R051 Acute cough: Secondary | ICD-10-CM | POA: Diagnosis not present

## 2021-07-14 DIAGNOSIS — J020 Streptococcal pharyngitis: Secondary | ICD-10-CM | POA: Diagnosis not present

## 2021-08-20 DIAGNOSIS — J029 Acute pharyngitis, unspecified: Secondary | ICD-10-CM | POA: Diagnosis not present

## 2021-09-03 DIAGNOSIS — F331 Major depressive disorder, recurrent, moderate: Secondary | ICD-10-CM | POA: Diagnosis not present

## 2021-09-03 DIAGNOSIS — F900 Attention-deficit hyperactivity disorder, predominantly inattentive type: Secondary | ICD-10-CM | POA: Diagnosis not present

## 2021-10-02 ENCOUNTER — Emergency Department (HOSPITAL_BASED_OUTPATIENT_CLINIC_OR_DEPARTMENT_OTHER): Payer: BC Managed Care – PPO

## 2021-10-02 ENCOUNTER — Emergency Department (HOSPITAL_BASED_OUTPATIENT_CLINIC_OR_DEPARTMENT_OTHER)
Admission: EM | Admit: 2021-10-02 | Discharge: 2021-10-02 | Disposition: A | Discharge status: Home / Self Care | Payer: BC Managed Care – PPO | Attending: Emergency Medicine | Admitting: Emergency Medicine

## 2021-10-02 ENCOUNTER — Other Ambulatory Visit: Payer: Self-pay

## 2021-10-02 DIAGNOSIS — M545 Low back pain, unspecified: Secondary | ICD-10-CM | POA: Insufficient documentation

## 2021-10-02 DIAGNOSIS — R109 Unspecified abdominal pain: Secondary | ICD-10-CM | POA: Insufficient documentation

## 2021-10-02 LAB — URINALYSIS, ROUTINE W REFLEX MICROSCOPIC
Bilirubin Urine: NEGATIVE
Glucose, UA: NEGATIVE mg/dL
Ketones, ur: NEGATIVE mg/dL
Leukocytes,Ua: NEGATIVE
Nitrite: NEGATIVE
Protein, ur: NEGATIVE mg/dL
Specific Gravity, Urine: 1.025 (ref 1.005–1.030)
pH: 6 (ref 5.0–8.0)

## 2021-10-02 LAB — CBC
HCT: 44.7 % (ref 36.0–46.0)
Hemoglobin: 14.3 g/dL (ref 12.0–15.0)
MCH: 26.3 pg (ref 26.0–34.0)
MCHC: 32 g/dL (ref 30.0–36.0)
MCV: 82.3 fL (ref 80.0–100.0)
Platelets: 306 10*3/uL (ref 150–400)
RBC: 5.43 MIL/uL — ABNORMAL HIGH (ref 3.87–5.11)
RDW: 14.6 % (ref 11.5–15.5)
WBC: 10.5 10*3/uL (ref 4.0–10.5)
nRBC: 0 % (ref 0.0–0.2)

## 2021-10-02 LAB — BASIC METABOLIC PANEL
Anion gap: 13 (ref 5–15)
BUN: 16 mg/dL (ref 6–20)
CO2: 24 mmol/L (ref 22–32)
Calcium: 9.9 mg/dL (ref 8.9–10.3)
Chloride: 100 mmol/L (ref 98–111)
Creatinine, Ser: 0.73 mg/dL (ref 0.44–1.00)
GFR, Estimated: >60 mL/min (ref >60)
Glucose, Bld: 91 mg/dL (ref 70–99)
Potassium: 3.9 mmol/L (ref 3.5–5.1)
Sodium: 137 mmol/L (ref 135–145)

## 2021-10-02 LAB — HEPATIC FUNCTION PANEL
ALT: 30 U/L (ref 0–44)
AST: 25 U/L (ref 15–41)
Albumin: 5.3 g/dL — ABNORMAL HIGH (ref 3.5–5.0)
Alkaline Phosphatase: 66 U/L (ref 38–126)
Bilirubin, Direct: 0.1 mg/dL (ref 0.0–0.2)
Indirect Bilirubin: 0.5 mg/dL (ref 0.3–0.9)
Total Bilirubin: 0.6 mg/dL (ref 0.3–1.2)
Total Protein: 8.2 g/dL — ABNORMAL HIGH (ref 6.5–8.1)

## 2021-10-02 LAB — PREGNANCY, URINE: Preg Test, Ur: NEGATIVE

## 2021-10-02 MED ORDER — SODIUM CHLORIDE 0.9 % IV BOLUS
1000.0000 mL | Freq: Once | INTRAVENOUS | Status: AC
  Administered 2021-10-02: 1000 mL via INTRAVENOUS

## 2021-10-02 MED ORDER — ACETAMINOPHEN 325 MG PO TABS
650.0000 mg | ORAL_TABLET | Freq: Once | ORAL | Status: AC
  Administered 2021-10-02: 650 mg via ORAL
  Filled 2021-10-02: qty 2

## 2021-10-02 NOTE — Discharge Instructions (Signed)
Your CT imaging and ultrasounds did not have any acute findings.  Please continue to manage pain from home with ibuprofen and Tylenol.  Call your PCP tomorrow morning to schedule a follow-up appointment within the next 2 to 3 days for reevaluation continue medical management.  Return to the ED for any new or worsening symptoms as discussed.

## 2021-10-02 NOTE — ED Notes (Signed)
Patient transported to Ultrasound 

## 2021-10-02 NOTE — ED Triage Notes (Signed)
UTI, lower back pain. "Feels like when I had kidney infection in the past" Started a week ago. Getting worse. Ibuprofen and tylenol around 10am. No relief

## 2021-10-02 NOTE — ED Notes (Signed)
Patient transported to CT 

## 2021-10-02 NOTE — ED Provider Notes (Signed)
[facility] EMERGENCY DEPT Provider Note   CSN: [id number] Arrival date & time: 10/02/21  5176     History  Chief Complaint  Patient presents with   Back Pain    Cathy Roman is a 26 y.o. female with Hx of ADHD, anxiety, depression, previous pyelonephritis affecting pregnancy, and previous ectopic pregnancy.  Presenting with chief complaint of lower back pain and abdominal pain.  Pain described as sharp and intermittent.  Started 1 week ago, remained constantly waxing and waning up until this morning when the pain became severe for about 4 hours.  States "this feels the similar as when I had a kidney infection in the past".  has tried Tylenol and ibuprofen without any relief.  Denies dysuria, increased frequency, or other urinary symptoms.  Denies fever, N/V/D, constipation, chest pain, shortness of breath.  Hx of IUD placement and Feb 2023.  Denies vaginal bleeding, pain, or discharge.  Had some spotting about a week ago.  No recent injury/trauma.  The history is provided by the patient and medical records.  Back Pain Associated symptoms: abdominal pain      Home Medications Prior to Admission medications   Medication Sig Start Date End Date Taking? Authorizing Provider  buPROPion (WELLBUTRIN XL) 150 MG 24 hr tablet Take 150 mg by mouth daily.    [provider]  FLUoxetine (PROZAC) 20 MG capsule Take 20 mg by mouth daily. 12/03/20   [provider]  methylphenidate 54 MG PO CR tablet Take 108 mg by mouth every morning.    [provider]      Allergies    Sulfa antibiotics    Review of Systems   Review of Systems  Gastrointestinal:  Positive for abdominal pain.  Musculoskeletal:  Positive for back pain.    Physical Exam Updated Vital Signs BP (!) 136/92   Pulse 78   Temp 98 F (36.7 C) (Oral)   Resp 16   LMP 10/01/2021 (Approximate)   SpO2 100%  Physical Exam Vitals and nursing note reviewed.  Constitutional:       General: She is not in acute distress.    Appearance: She is well-developed. She is not ill-appearing, toxic-appearing or diaphoretic.  HENT:     Head: Normocephalic and atraumatic.  Eyes:     General: No scleral icterus.    Conjunctiva/sclera: Conjunctivae normal.  Cardiovascular:     Rate and Rhythm: Normal rate and regular rhythm.     Pulses: Normal pulses.     Heart sounds: No murmur heard. Pulmonary:     Effort: Pulmonary effort is normal. No respiratory distress.     Breath sounds: Normal breath sounds.  Chest:     Chest wall: No tenderness.  Abdominal:     General: There is no distension.     Palpations: Abdomen is soft. There is no shifting dullness or mass.     Tenderness: There is abdominal tenderness (Left sided). There is no right CVA tenderness, left CVA tenderness, guarding or rebound. Negative signs include Murphy's sign and McBurney's sign.     Comments: Mild bilateral flank tenderness  Musculoskeletal:        General: No swelling.     Cervical back: Neck supple. No rigidity.     Comments: No midline lumbar tenderness.  No crepitus, tenderness, erythema, or warmth of lumbar region.    Skin:    General: Skin is warm and dry.     Capillary Refill: Capillary refill takes less than 2 seconds.  Coloration: Skin is not jaundiced or pale.     Findings: No bruising or rash.  Neurological:     Mental Status: She is alert and oriented to person, place, and time.     GCS: GCS eye subscore is 4. GCS verbal subscore is 5. GCS motor subscore is 6.     Sensory: Sensation is intact.     Motor: Motor function is intact.     Coordination: Coordination is intact.     Gait: Gait is intact.     Comments: No saddle anesthesia.    Psychiatric:        Mood and Affect: Mood normal.     ED Results / Procedures / Treatments   Labs (all labs ordered are listed, but only abnormal results are displayed) Labs Reviewed  URINALYSIS, ROUTINE W REFLEX MICROSCOPIC - Abnormal; Notable  for the following components:      Result Value   Hgb urine dipstick MODERATE (*)    All other components within normal limits  CBC - Abnormal; Notable for the following components:   RBC 5.43 (*)    All other components within normal limits  PREGNANCY, URINE  BASIC METABOLIC PANEL  HEPATIC FUNCTION PANEL    EKG None  Radiology No results found.  Procedures Procedures    Medications Ordered in ED Medications  sodium chloride 0.9 % bolus 1,000 mL (has no administration in time range)  acetaminophen (TYLENOL) tablet 650 mg (650 mg Oral Given 10/02/21 2020)    ED Course/ Medical Decision Making/ A&P                           Medical Decision Making Amount and/or Complexity of Data Reviewed Labs: ordered. Radiology: ordered.  Risk OTC drugs.   25 y.o. female presents to the ED for concern of Back Pain   This involves an extensive number of treatment options, and is a complaint that carries with it a high risk of complications and morbidity.  The emergent differential diagnosis prior to evaluation includes, but is not limited to: UTI, pyelonephritis, renal calculi, ovarian cyst rupture  This is not an exhaustive differential.   Past Medical History / Co-morbidities / Social History: Hx of ADHD, anxiety, depression, previous pyelonephritis affecting pregnancy, and previous ectopic pregnancy Social Determinants of Health include: None  Additional History:  Obtained by chart review.  Notably hx of IUD placement and prior spontaneous abortion  Lab Tests: I ordered, and personally interpreted labs.  The pertinent results include:   Urine pregnancy negative CBC and CMP unremarkable UA with some RBCs, however no evidence of infection  Imaging Studies: I ordered imaging studies including CT renal study and pelvic US series.   I independently visualized and interpreted imaging which showed no acute findings of abdomen or pelvis; IUD in place I agree with the radiologist  interpretation.  ED Course: Pt well-appearing on exam.  Nontoxic, nonseptic appearing in NAD.  Afebrile.  Presenting with left-sided abdominal tenderness and mild bilateral flank tenderness, worse on left than right.  Does not meet SIRS or sepsis criteria.  Negative Murphy sign and McBurney sign.  Without suprapubic tenderness.  Without urinary symptoms or changes in bowel habits.  UA with few RBCs, however without evidence of UTI.  Low suspicion of pyelonephritis, bowel obstruction, or bowel perforation at this time.  Urine pregnancy negative, low suspicion for ectopic.  Clinical presentation not suggestive of TOA or ovarian torsion.  Renal function appears unaffected.  Due to the presence of blood in the urine, clinical presentation, and previous renal issues, suspicious of possible renal calculi vs ovarian cyst rupture.  Fluid bolus provided, pain managed in ED. CT imaging and US pelvic series negative for acute abdominopelvic pathology, such as renal calculi, pyelonephritis, ovarian torsion, ovarian cyst rupture, TOA, bowel obstruction, bowel perforation, or acute cystitis.  MSK and neuro exam unremarkable as described above.  Overall, I am uncertain the exact etiology of the patient's symptoms.  However, I do not believe she is currently experiencing a medical, surgical, or psychiatric emergency.  Pt reports improvement since arrival to ED.  Pain may be related to strain or muscle spasm, or menstrual cramps.  Recommend close follow up with PCP for re-evaluation and management with conservative measures.  Pt reports satisfaction with today's encounter.  Patient in NAD and in good condition at time of discharge.  Disposition: After consideration the patient's encounter today, I do not feel today's workup suggests an emergent condition requiring admission or immediate intervention beyond what has been performed at this time.  Safe for discharge; instructed to return immediately for worsening symptoms,  change in symptoms or any other concerns.  I have reviewed the patients home medicines and have made adjustments as needed.  Discussed course of treatment with the patient, whom demonstrated understanding.  Patient in agreement and has no further questions.    I discussed this case with my attending physician Dr. Alvino Chapel, who agreed with the proposed treatment course and cosigned this note including patient's presenting symptoms, physical exam, and planned diagnostics and interventions.  Attending physician stated agreement with plan or made changes to plan which were implemented.     This chart was dictated using voice recognition software.  Despite best efforts to proofread, errors can occur which can change the documentation meaning.         Final Clinical Impression(s) / ED Diagnoses Final diagnoses:  Acute bilateral low back pain, unspecified whether sciatica present    Rx / DC Orders ED Discharge Orders     None         Candace Cruise [id number]    Davonna Belling, MD 10/11/21 0700

## 2021-11-05 DIAGNOSIS — R21 Rash and other nonspecific skin eruption: Secondary | ICD-10-CM | POA: Diagnosis not present

## 2021-11-05 DIAGNOSIS — B9562 Methicillin resistant Staphylococcus aureus infection as the cause of diseases classified elsewhere: Secondary | ICD-10-CM | POA: Diagnosis not present

## 2021-11-05 DIAGNOSIS — L089 Local infection of the skin and subcutaneous tissue, unspecified: Secondary | ICD-10-CM | POA: Diagnosis not present

## 2021-11-07 DIAGNOSIS — J029 Acute pharyngitis, unspecified: Secondary | ICD-10-CM | POA: Diagnosis not present

## 2021-11-07 DIAGNOSIS — R0989 Other specified symptoms and signs involving the circulatory and respiratory systems: Secondary | ICD-10-CM | POA: Diagnosis not present

## 2021-11-07 DIAGNOSIS — J069 Acute upper respiratory infection, unspecified: Secondary | ICD-10-CM | POA: Diagnosis not present

## 2022-03-30 ENCOUNTER — Emergency Department (HOSPITAL_BASED_OUTPATIENT_CLINIC_OR_DEPARTMENT_OTHER): Payer: Medicaid Other

## 2022-03-30 ENCOUNTER — Encounter (HOSPITAL_BASED_OUTPATIENT_CLINIC_OR_DEPARTMENT_OTHER): Payer: Self-pay

## 2022-03-30 ENCOUNTER — Other Ambulatory Visit: Payer: Self-pay

## 2022-03-30 ENCOUNTER — Emergency Department (HOSPITAL_BASED_OUTPATIENT_CLINIC_OR_DEPARTMENT_OTHER)
Admission: EM | Admit: 2022-03-30 | Discharge: 2022-03-30 | Disposition: A | Discharge status: Home / Self Care | Payer: Medicaid Other | Attending: Emergency Medicine | Admitting: Emergency Medicine

## 2022-03-30 DIAGNOSIS — E871 Hypo-osmolality and hyponatremia: Secondary | ICD-10-CM | POA: Diagnosis not present

## 2022-03-30 DIAGNOSIS — R1032 Left lower quadrant pain: Secondary | ICD-10-CM | POA: Insufficient documentation

## 2022-03-30 DIAGNOSIS — I10 Essential (primary) hypertension: Secondary | ICD-10-CM | POA: Diagnosis not present

## 2022-03-30 DIAGNOSIS — Z79899 Other long term (current) drug therapy: Secondary | ICD-10-CM | POA: Insufficient documentation

## 2022-03-30 DIAGNOSIS — R109 Unspecified abdominal pain: Secondary | ICD-10-CM

## 2022-03-30 HISTORY — DX: Tubulo-interstitial nephritis, not specified as acute or chronic: N12

## 2022-03-30 LAB — LIPASE, BLOOD: Lipase: 28 U/L (ref 11–51)

## 2022-03-30 LAB — BASIC METABOLIC PANEL
Anion gap: 6 (ref 5–15)
BUN: 11 mg/dL (ref 6–20)
CO2: 24 mmol/L (ref 22–32)
Calcium: 8.5 mg/dL — ABNORMAL LOW (ref 8.9–10.3)
Chloride: 98 mmol/L (ref 98–111)
Creatinine, Ser: 0.64 mg/dL (ref 0.44–1.00)
GFR, Estimated: >60 mL/min (ref >60)
Glucose, Bld: 88 mg/dL (ref 70–99)
Potassium: 3.6 mmol/L (ref 3.5–5.1)
Sodium: 128 mmol/L — ABNORMAL LOW (ref 135–145)

## 2022-03-30 LAB — URINALYSIS, ROUTINE W REFLEX MICROSCOPIC
Bilirubin Urine: NEGATIVE
Glucose, UA: NEGATIVE mg/dL
Ketones, ur: NEGATIVE mg/dL
Leukocytes,Ua: NEGATIVE
Nitrite: NEGATIVE
Protein, ur: NEGATIVE mg/dL
Specific Gravity, Urine: 1.015 (ref 1.005–1.030)
pH: 7 (ref 5.0–8.0)

## 2022-03-30 LAB — CBC
HCT: 40.1 % (ref 36.0–46.0)
Hemoglobin: 13 g/dL (ref 12.0–15.0)
MCH: 26.9 pg (ref 26.0–34.0)
MCHC: 32.4 g/dL (ref 30.0–36.0)
MCV: 82.9 fL (ref 80.0–100.0)
Platelets: 268 10*3/uL (ref 150–400)
RBC: 4.84 MIL/uL (ref 3.87–5.11)
RDW: 14.3 % (ref 11.5–15.5)
WBC: 6.9 10*3/uL (ref 4.0–10.5)
nRBC: 0 % (ref 0.0–0.2)

## 2022-03-30 LAB — PREGNANCY, URINE: Preg Test, Ur: NEGATIVE

## 2022-03-30 LAB — URINALYSIS, MICROSCOPIC (REFLEX)

## 2022-03-30 MED ORDER — KETOROLAC TROMETHAMINE 30 MG/ML IJ SOLN
30.0000 mg | Freq: Once | INTRAMUSCULAR | Status: AC
  Administered 2022-03-30: 30 mg via INTRAVENOUS
  Filled 2022-03-30: qty 1

## 2022-03-30 MED ORDER — SODIUM CHLORIDE 0.9 % IV BOLUS
1000.0000 mL | Freq: Once | INTRAVENOUS | Status: AC
  Administered 2022-03-30: 1000 mL via INTRAVENOUS

## 2022-03-30 MED ORDER — ONDANSETRON HCL 4 MG/2ML IJ SOLN
4.0000 mg | Freq: Once | INTRAMUSCULAR | Status: AC
  Administered 2022-03-30: 4 mg via INTRAVENOUS
  Filled 2022-03-30: qty 2

## 2022-03-30 NOTE — ED Triage Notes (Signed)
Pt presents with complaints of foul smelling urine and vaginal bleeding x 1 week. Bleeding gradually increasing  Reports left flank pain

## 2022-03-30 NOTE — Discharge Instructions (Signed)
You dont have any urinary infection and we also ruled out emergent gynecological problems. Your IUD positioning is a little low and I would double check with your OBGYN in the next couple weeks and let them know about your bleeding and IUD

## 2022-03-30 NOTE — ED Provider Notes (Signed)
[facility] Provider Note   CSN: [id number] Arrival date & time: 03/30/22  1445     History  Chief Complaint  Patient presents with   Flank Pain    Cathy Roman is a 27 y.o. female with PMH HTN, depression, anxiety, migraines, pyelonephritis during pregnancy, prior ectopic pregnancy, C-section presenting with left flank pain.  She says her pain has been going on for about a month now but is worsened over the past couple weeks.  She says that she came into the ED today because her pain feels like when she had pyelonephritis during her pregnancy and she just wanted to get checked out.  She thinks she might have had increased urinary frequency over the past week or so.  She denies any dysuria, clear hematuria,, fevers or chills, vomiting, diarrhea, cough.  She additionally notes that she has a history of heavy periods, which improved after placement of her IUD, but that she has started having heavier periods again, and is currently having some vaginal bleeding off of her usual cycle.  Her last menstrual cycle was February 28 - March 1.  She is currently sexually active with her spouse and has had no other sexual partners recently.  She was checked for ovarian cysts and September of last year and did not have any.  She was also checked for kidney stones at same time and did not have any.  She has never had kidney stones in the past.   Home Medications Prior to Admission medications   Medication Sig Start Date End Date Taking? Authorizing Provider  buPROPion (WELLBUTRIN XL) 150 MG 24 hr tablet Take 150 mg by mouth daily.    [provider]  FLUoxetine (PROZAC) 20 MG capsule Take 20 mg by mouth daily. 12/03/20   [provider]  methylphenidate 54 MG PO CR tablet Take 108 mg by mouth every morning.    [provider]      Allergies    Sulfa antibiotics    Review of Systems   See HPI  Physical Exam Updated Vital  Signs BP (!) 154/103   Pulse (!) 108   Temp 98.5 F (36.9 C) (Oral)   Resp 18   Ht '5\' 4"'$  (1.626 m)   Wt 102.1 kg   SpO2 100%   BMI 38.62 kg/m  Physical Exam Constitutional:      General: She is not in acute distress.    Appearance: Normal appearance. She is not ill-appearing.  Cardiovascular:     Rate and Rhythm: Normal rate and regular rhythm.     Heart sounds: No murmur heard.    No friction rub. No gallop.  Pulmonary:     Effort: Pulmonary effort is normal. No respiratory distress.     Breath sounds: Normal breath sounds.  Abdominal:     General: Bowel sounds are normal. There is no distension.     Palpations: Abdomen is soft. There is no mass.     Tenderness: There is abdominal tenderness (LLQ and left flank). There is no right CVA tenderness, left CVA tenderness or guarding.  Skin:    General: Skin is warm and dry.     Findings: No rash.  Neurological:     General: No focal deficit present.     Mental Status: She is alert and oriented to person, place, and time.  Psychiatric:        Mood and Affect: Mood normal.  Behavior: Behavior normal.     ED Results / Procedures / Treatments   Labs (all labs ordered are listed, but only abnormal results are displayed) Labs Reviewed  BASIC METABOLIC PANEL - Abnormal; Notable for the following components:      Result Value   Sodium 128 (*)    Calcium 8.5 (*)    All other components within normal limits  URINALYSIS, ROUTINE W REFLEX MICROSCOPIC - Abnormal; Notable for the following components:   Hgb urine dipstick MODERATE (*)    All other components within normal limits  URINALYSIS, MICROSCOPIC (REFLEX) - Abnormal; Notable for the following components:   Bacteria, UA FEW (*)    All other components within normal limits  CBC  LIPASE, BLOOD  PREGNANCY, URINE    EKG None  Radiology No results found.  Procedures  Medications Ordered in ED Medications  ondansetron (ZOFRAN) injection 4 mg (has no  administration in time range)  sodium chloride 0.9 % bolus 1,000 mL (has no administration in time range)  ketorolac (TORADOL) 30 MG/ML injection 30 mg (has no administration in time range)    ED Course/ Medical Decision Making/ A&P    Medical Decision Making Amount and/or Complexity of Data Reviewed Labs: ordered. Radiology: ordered.  Risk Prescription drug management.   27 year old female with PMH HTN, depression, anxiety, migraines, pyelonephritis during pregnancy, prior ectopic pregnancy, C-section presenting with subacute left flank pain and chronically worsening vaginal bleeding.  She is afebrile, mildly tachycardic and hypertensive on initial presentation.  She has some left lower quadrant abdominal tenderness, left flank tenderness to palpation.  No significant left CVA tenderness. Initial DDx included pyelonephritis, UTI, ovarian cyst, ectopic pregnancy, ovarian torsion, nephrolithiasis.  No leukocytosis or AKI.  Pregnancy test is negative.  UA shows moderate hemoglobin, some RBCs, and few bacteria but it was not a clean-catch, no leukocytes, WBCs, or nitrites.  She does not have clear UTI or pyelonephritis on initial workup, low concern for ectopic with negative pregnancy test, presentation is unlikely to be ovarian torsion with timeline happening over a month.  Ovarian cyst is a possibility.  Will further workup with abdominal and transvaginal ultrasound kidney and pelvic studies.  Of note, she does have hyponatremia 128 initially, she has been having poor p.o. intake so possibly hypovolemic.  Will give fluid bolus and can follow-up in the outpatient setting.  If it does not improve, another thing to consider is that she is on Prozac.  Update: The ultrasounds have ruled out hydronephrosis, cystitis, ovarian cysts or torsion, or any other endometrial masses.  The positioning of her IUD is a little bit low, recommended to follow-up with [place] in the outpatient setting.  She is amenable  to discharge at this time  Final Clinical Impression(s) / ED Diagnoses Final diagnoses:  None    Rx / DC Orders ED Discharge Orders     None         Linus Galas, MD 03/30/22 Patricia Pesa, MD 03/30/22 [phone]

## 2022-03-30 NOTE — ED Notes (Signed)
Pt. Reports she has had some vaginal bleeding with pain more on the L side.  Pt. In no distress.

## 2022-04-02 ENCOUNTER — Encounter: Payer: Self-pay | Admitting: Family Medicine

## 2022-04-02 ENCOUNTER — Ambulatory Visit: Payer: Medicaid Other | Admitting: Family Medicine

## 2022-04-02 VITALS — BP 135/82 | HR 98 | Ht 64.0 in | Wt 217.0 lb

## 2022-04-02 DIAGNOSIS — Z30432 Encounter for removal of intrauterine contraceptive device: Secondary | ICD-10-CM

## 2022-04-02 DIAGNOSIS — Z975 Presence of (intrauterine) contraceptive device: Secondary | ICD-10-CM

## 2022-04-02 DIAGNOSIS — Z30430 Encounter for insertion of intrauterine contraceptive device: Secondary | ICD-10-CM | POA: Diagnosis not present

## 2022-04-02 MED ORDER — KETOROLAC TROMETHAMINE 10 MG PO TABS: 10.0000 mg | ORAL_TABLET | Freq: Four times a day (QID) | ORAL | 0 refills | Status: DC | PRN

## 2022-04-02 MED ORDER — LEVONORGESTREL 20.1 MCG/DAY IU IUD
1.0000 | INTRAUTERINE_SYSTEM | Freq: Once | INTRAUTERINE | Status: AC
  Administered 2022-04-02: 1 via INTRAUTERINE

## 2022-04-02 NOTE — Progress Notes (Signed)
GYNECOLOGY OFFICE VISIT NOTE  History:   Cathy Roman is a 27 y.o. [phone] here today for IUD issue.  Had [place] that showed IUD displaced into lower uterine segment Reports she has had a constant period for a year and has had lots of cramping Thought it would take a long time for the IUD to take full effect Would like removal and replacement if possible today  Health Maintenance Due  Topic Date Due   COVID-19 Vaccine (1) Never done   HPV VACCINES (1 - 2-dose series) Never done   HIV Screening  Never done   Hepatitis C Screening  Never done   INFLUENZA VACCINE  Never done    Past Medical History:  Diagnosis Date   ADHD    ADD   Anxiety disorder    Chronic migraine without aura, intractable, without status migrainosus    Depression    Former cigarette smoker    Psychiatric disorder    Pyelonephritis     Past Surgical History:  Procedure Laterality Date   CESAREAN SECTION  05/21/2020   Procedure: CESAREAN SECTION;  Surgeon: Griffin Basil, MD;  Location: [facility] LD ORS;  Service: Obstetrics;;   NONE      The following portions of the patient's history were reviewed and updated as appropriate: allergies, current medications, past family history, past medical history, past social history, past surgical history and problem list.   Health Maintenance:   Last pap: 01/09/2020  Cytology - PAP  *Pap Smear: NILM *HPV: HRHPV - Pap in 3 years Due Date: 01/09/2023     Last mammogram:  N/a    Review of Systems:  Pertinent items noted in HPI and remainder of comprehensive ROS otherwise negative.  Physical Exam:  BP 135/82   Pulse 98   Ht '5\' 4"'$  (1.626 m)   Wt 217 lb (98.4 kg)   LMP 03/16/2022 (Exact Date)   Breastfeeding No   BMI 37.25 kg/m  CONSTITUTIONAL: Well-developed, well-nourished female in no acute distress.  HEENT:  Normocephalic, atraumatic. External right and left ear normal. No scleral icterus.  NECK: Normal range of motion, supple, no masses noted on  observation SKIN: No rash noted. Not diaphoretic. No erythema. No pallor. MUSCULOSKELETAL: Normal range of motion. No edema noted. NEUROLOGIC: Alert and oriented to person, place, and time. Normal muscle tone coordination.  PSYCHIATRIC: Normal mood and affect. Normal behavior. Normal judgment and thought content. RESPIRATORY: Effort normal, no problems with respiration noted  PELVIC:  Cervix normal. Small amount of blood in vault. IUD strings extremely long, approximately 10 cm  Labs and Imaging Results for orders placed or performed during the hospital encounter of 03/30/22 (from the past 168 hour(s))  Basic metabolic panel   Collection Time: 03/30/22  3:27 PM  Result Value Ref Range   Sodium 128 (L) 135 - 145 mmol/L   Potassium 3.6 3.5 - 5.1 mmol/L   Chloride 98 98 - 111 mmol/L   CO2 24 22 - 32 mmol/L   Glucose, Bld 88 70 - 99 mg/dL   BUN 11 6 - 20 mg/dL   Creatinine, Ser 0.64 0.44 - 1.00 mg/dL   Calcium 8.5 (L) 8.9 - 10.3 mg/dL   GFR, Estimated >60 >60 mL/min   Anion gap 6 5 - 15  CBC   Collection Time: 03/30/22  3:27 PM  Result Value Ref Range   WBC 6.9 4.0 - 10.5 K/uL   RBC 4.84 3.87 - 5.11 MIL/uL   Hemoglobin 13.0 12.0 -  15.0 g/dL   HCT 40.1 36.0 - 46.0 %   MCV 82.9 80.0 - 100.0 fL   MCH 26.9 26.0 - 34.0 pg   MCHC 32.4 30.0 - 36.0 g/dL   RDW 14.3 11.5 - 15.5 %   Platelets 268 150 - 400 K/uL   nRBC 0.0 0.0 - 0.2 %  Lipase, blood   Collection Time: 03/30/22  3:27 PM  Result Value Ref Range   Lipase 28 11 - 51 U/L  Pregnancy, urine   Collection Time: 03/30/22  3:27 PM  Result Value Ref Range   Preg Test, Ur NEGATIVE NEGATIVE  Urinalysis, Routine w reflex microscopic -Urine, Clean Catch   Collection Time: 03/30/22  3:27 PM  Result Value Ref Range   Color, Urine YELLOW YELLOW   APPearance CLEAR CLEAR   Specific Gravity, Urine 1.015 1.005 - 1.030   pH 7.0 5.0 - 8.0   Glucose, UA NEGATIVE NEGATIVE mg/dL   Hgb urine dipstick MODERATE (A) NEGATIVE   Bilirubin Urine  NEGATIVE NEGATIVE   Ketones, ur NEGATIVE NEGATIVE mg/dL   Protein, ur NEGATIVE NEGATIVE mg/dL   Nitrite NEGATIVE NEGATIVE   Leukocytes,Ua NEGATIVE NEGATIVE  Urinalysis, Microscopic (reflex)   Collection Time: 03/30/22  3:27 PM  Result Value Ref Range   RBC / HPF 6-10 0 - 5 RBC/hpf   WBC, UA 0-5 0 - 5 WBC/hpf   Bacteria, UA FEW (A) NONE SEEN   Squamous Epithelial / HPF 6-10 0 - 5 /HPF   US Renal  Result Date: 03/30/2022 CLINICAL DATA:  Left upper quadrant and left lower quadrant pain for 1 month with vaginal bleeding/hematuria. IUD present. Flank pain. EXAM: RENAL / URINARY TRACT ULTRASOUND COMPLETE COMPARISON:  Renal ultrasound 05/19/2020 (performed when patient was [redacted] weeks pregnant); noncontrast CT abdomen and pelvis 10/02/2021. FINDINGS: Right Kidney: Renal measurements: 12.5 x 5.2 x 5.7 cm = volume: 192 mL. Echogenicity within normal limits. No mass or hydronephrosis visualized. Left Kidney: Renal measurements: 11.8 x 6.1 x 4.9 cm = volume: 184 mL. Echogenicity within normal limits. No mass or hydronephrosis visualized. Bladder: Appears normal for degree of bladder distention. Bilateral ureteral jets were visualized. Other: None. IMPRESSION: Normal sonographic appearance of the kidneys and urinary bladder. Electronically Signed   By: Yvonne Kendall M.D.   On: 03/30/2022 18:07   US Pelvis Complete  Result Date: 03/30/2022 CLINICAL DATA:  Left lower quadrant pain for 1 month. Vaginal bleeding. One prior C-section. History of one ectopic pregnancy. IUD present. EXAM: TRANSABDOMINAL AND TRANSVAGINAL ULTRASOUND OF PELVIS DOPPLER ULTRASOUND OF OVARIES TECHNIQUE: Both transabdominal and transvaginal ultrasound examinations of the pelvis were performed. Transabdominal technique was performed for global imaging of the pelvis including uterus, ovaries, adnexal regions, and pelvic cul-de-sac. It was necessary to proceed with endovaginal exam following the transabdominal exam to visualize the uterus,  endometrium, bilateral ovaries, and bilateral adnexae. Color and duplex Doppler ultrasound was utilized to evaluate blood flow to the ovaries. COMPARISON:  Pelvic ultrasound 10/02/2021 FINDINGS: Uterus Measurements: 8.4 x 3.9 x 4.6 cm = volume: 80 mL. No fibroids or other mass visualized. Endometrium Thickness: 8 mm, within normal limits. An IUD is visualized within the endometrial canal, however this is lower than expected, within the lower uterine segment extending into the upper cervix. Right ovary Measurements: 3.9 x 2.0 x 2.3 cm = volume: 9 mL. Normal follicles are seen, measuring up to 2.0 cm. No follow-up imaging is recommended. Left ovary Measurements: 2.9 x 1.5 x 2.6 cm = volume: 6 mL. Normal  appearance/no adnexal mass. Pulsed Doppler evaluation of both ovaries demonstrates normal low-resistance arterial and venous waveforms. Other findings No abnormal free fluid. IMPRESSION: 1. The IUD is in the endometrial canal but is positioned lower than expected, within the lower uterine segment extending into the upper cervix. This is lower than on the 10/02/2021 ultrasound. 2. Normal appearance of both ovaries. No evidence of ovarian torsion. Electronically Signed   By: Yvonne Kendall M.D.   On: 03/30/2022 18:03   US Transvaginal Non-OB  Result Date: 03/30/2022 CLINICAL DATA:  Left lower quadrant pain for 1 month. Vaginal bleeding. One prior C-section. History of one ectopic pregnancy. IUD present. EXAM: TRANSABDOMINAL AND TRANSVAGINAL ULTRASOUND OF PELVIS DOPPLER ULTRASOUND OF OVARIES TECHNIQUE: Both transabdominal and transvaginal ultrasound examinations of the pelvis were performed. Transabdominal technique was performed for global imaging of the pelvis including uterus, ovaries, adnexal regions, and pelvic cul-de-sac. It was necessary to proceed with endovaginal exam following the transabdominal exam to visualize the uterus, endometrium, bilateral ovaries, and bilateral adnexae. Color and duplex Doppler  ultrasound was utilized to evaluate blood flow to the ovaries. COMPARISON:  Pelvic ultrasound 10/02/2021 FINDINGS: Uterus Measurements: 8.4 x 3.9 x 4.6 cm = volume: 80 mL. No fibroids or other mass visualized. Endometrium Thickness: 8 mm, within normal limits. An IUD is visualized within the endometrial canal, however this is lower than expected, within the lower uterine segment extending into the upper cervix. Right ovary Measurements: 3.9 x 2.0 x 2.3 cm = volume: 9 mL. Normal follicles are seen, measuring up to 2.0 cm. No follow-up imaging is recommended. Left ovary Measurements: 2.9 x 1.5 x 2.6 cm = volume: 6 mL. Normal appearance/no adnexal mass. Pulsed Doppler evaluation of both ovaries demonstrates normal low-resistance arterial and venous waveforms. Other findings No abnormal free fluid. IMPRESSION: 1. The IUD is in the endometrial canal but is positioned lower than expected, within the lower uterine segment extending into the upper cervix. This is lower than on the 10/02/2021 ultrasound. 2. Normal appearance of both ovaries. No evidence of ovarian torsion. Electronically Signed   By: Yvonne Kendall M.D.   On: 03/30/2022 18:03   [place] Art/Ven Flow Abd Pelv Doppler  Result Date: 03/30/2022 CLINICAL DATA:  Left lower quadrant pain for 1 month. Vaginal bleeding. One prior C-section. History of one ectopic pregnancy. IUD present. EXAM: TRANSABDOMINAL AND TRANSVAGINAL ULTRASOUND OF PELVIS DOPPLER ULTRASOUND OF OVARIES TECHNIQUE: Both transabdominal and transvaginal ultrasound examinations of the pelvis were performed. Transabdominal technique was performed for global imaging of the pelvis including uterus, ovaries, adnexal regions, and pelvic cul-de-sac. It was necessary to proceed with endovaginal exam following the transabdominal exam to visualize the uterus, endometrium, bilateral ovaries, and bilateral adnexae. Color and duplex Doppler ultrasound was utilized to evaluate blood flow to the ovaries. COMPARISON:   Pelvic ultrasound 10/02/2021 FINDINGS: Uterus Measurements: 8.4 x 3.9 x 4.6 cm = volume: 80 mL. No fibroids or other mass visualized. Endometrium Thickness: 8 mm, within normal limits. An IUD is visualized within the endometrial canal, however this is lower than expected, within the lower uterine segment extending into the upper cervix. Right ovary Measurements: 3.9 x 2.0 x 2.3 cm = volume: 9 mL. Normal follicles are seen, measuring up to 2.0 cm. No follow-up imaging is recommended. Left ovary Measurements: 2.9 x 1.5 x 2.6 cm = volume: 6 mL. Normal appearance/no adnexal mass. Pulsed Doppler evaluation of both ovaries demonstrates normal low-resistance arterial and venous waveforms. Other findings No abnormal free fluid. IMPRESSION: 1. The IUD is  in the endometrial canal but is positioned lower than expected, within the lower uterine segment extending into the upper cervix. This is lower than on the 10/02/2021 ultrasound. 2. Normal appearance of both ovaries. No evidence of ovarian torsion. Electronically Signed   By: Yvonne Kendall M.D.   On: 03/30/2022 18:03      Assessment and Plan:   Problem List Items Addressed This Visit       Other   IUD (intrauterine device) in place    Has not had adequate control of menstruation, likely secondary to displacement of IUD. She accepted removal and replacement which was uncomplicated. Will see back in 3 months for string check and to evaluate menstrual symptoms, will need to consider alternative if not improved by that time.       Relevant Medications   ketorolac (TORADOL) 10 MG tablet   Other Visit Diagnoses     Encounter for IUD insertion    -  Primary   Relevant Medications   ketorolac (TORADOL) 10 MG tablet   Encounter for IUD removal           Routine preventative health maintenance measures emphasized. Please refer to After Visit Summary for other counseling recommendations.   Return in about 3 months (around 07/03/2022) for iud check.    Total  face-to-face time with patient: 20 minutes.  Over 50% of encounter was spent on counseling and coordination of care.   Clarnce Flock, MD/MPH Attending Family Medicine Physician, [facility] for [facility], [place]

## 2022-04-02 NOTE — Progress Notes (Signed)
    GYNECOLOGY OFFICE PROCEDURE NOTE  Cathy Roman is a 27 y.o. [phone] here for [name] IUD removal and re-insertion. No GYN concerns.  Last pap smear was on 01/09/2020 and was normal.  IUD Removal and Reinsertion Procedure Note Patient identified, informed consent performed, consent signed.   Discussed risks of irregular bleeding, increased cramping, infection, malpositioning or misplacement of the IUD outside the uterus which may require further procedure such as laparoscopy. Also discussed >99% contraception efficacy, increased risk of ectopic pregnancy with failure of method.  Time out was performed.  Patient was in the dorsal lithotomy position, normal external genitalia was noted.  A speculum was placed in the patient's vagina, normal discharge was noted, no lesions. The cervix was visualized, no lesions, no abnormal discharge.  The strings of the IUD were grasped and pulled using ring forceps. The IUD was removed in its entirety.   Cervix was then cleaned with Betadine x 2. Uterus sounded to 8 cm. IUD placed per manufacturer's recommendations.  Strings trimmed to 3 cm. Patient tolerated procedure well.   Patient was given post-procedure instructions.  She was advised to have backup contraception for one week.  Patient was also asked to check IUD strings periodically and follow up in 12 weeks for IUD check.  Clarnce Flock, MD/MPH Attending Family Medicine Physician, [facility] for [facility], [place]

## 2022-04-02 NOTE — Assessment & Plan Note (Signed)
Has not had adequate control of menstruation, likely secondary to displacement of IUD. She accepted removal and replacement which was uncomplicated. Will see back in 3 months for string check and to evaluate menstrual symptoms, will need to consider alternative if not improved by that time.

## 2022-09-22 ENCOUNTER — Other Ambulatory Visit: Payer: Self-pay

## 2022-09-22 ENCOUNTER — Encounter: Payer: Self-pay | Admitting: Advanced Practice Midwife

## 2022-09-22 ENCOUNTER — Ambulatory Visit (INDEPENDENT_AMBULATORY_CARE_PROVIDER_SITE_OTHER): Payer: Medicaid Other | Admitting: Advanced Practice Midwife

## 2022-09-22 VITALS — BP 121/82 | HR 87 | Wt 203.6 lb

## 2022-09-22 DIAGNOSIS — N921 Excessive and frequent menstruation with irregular cycle: Secondary | ICD-10-CM | POA: Diagnosis not present

## 2022-09-22 DIAGNOSIS — Z30431 Encounter for routine checking of intrauterine contraceptive device: Secondary | ICD-10-CM | POA: Diagnosis not present

## 2022-09-22 NOTE — Progress Notes (Signed)
    GYNECOLOGY OFFICE ENCOUNTER NOTE  History:  Cathy Roman is a 27 y.o. year old female here today for today for IUD string check; Liletta IUD was placed 03/2022. Hx IUD migration into LUS in March 2024. IUD replaced. Concerned about bleeding with IUD but notes it is much less than with previous IUD.   Reports the following since IUD insertion: Bleeding irreg, light bleeding Abd pain: mild-mod cramping Fever: denies Dyspareunia: denies  The following portions of the patient's history were reviewed and updated as appropriate: allergies, current medications, past family history, past medical history, past social history, past surgical history and problem list. Last pap smear on 05/2022 was normal.  Review of Systems:  Pertinent items are noted in HPI.   Objective:  Physical Exam Blood pressure 101/70, pulse 75, height 5\' 4"  (1.626 m), weight 160 lb (72.6 kg), last menstrual period 03/12/2018, currently breastfeeding. CONSTITUTIONAL: Well-developed, well-nourished female in no acute distress.  SKIN: No pallor HENT:  Normocephalic, atraumatic. External right and left ear normal. Oropharynx is clear and moist EYES: Conjunctivae. No scleral icterus.  CARDIOVASCULAR: Normal heart rate noted RESPIRATORY: Effort normal, no problems with respiration noted ABDOMEN: Soft, no distention noted.   PELVIC: Normal appearing external genitalia; normal appearing vaginal mucosa and cervix. IUD strings initially not seen but Pap brush used to tease out strings. IUD tip not seen of felt of bimanual exam.  Assessment & Plan:  Patient to keep IUD in place for up to 8 years; can come in for removal if she desires pregnancy earlier or for any concerning side effects. Pelvic US ordered to check IUD placement.   Katrinka Blazing, [place], [place] 09/22/2022 5:07 PM

## 2022-09-22 NOTE — Progress Notes (Signed)
Pt scheduled for U/S on 09/30/22 with arrival at 10:45.  Pt notified via MyChart.   Leonette Nutting  09/22/22

## 2022-09-30 ENCOUNTER — Ambulatory Visit (HOSPITAL_COMMUNITY)
Admission: RE | Admit: 2022-09-30 | Discharge: 2022-09-30 | Disposition: A | Discharge status: Home / Self Care | Payer: Medicaid Other | Source: Ambulatory Visit | Attending: Advanced Practice Midwife | Admitting: Advanced Practice Midwife

## 2022-09-30 DIAGNOSIS — N921 Excessive and frequent menstruation with irregular cycle: Secondary | ICD-10-CM | POA: Insufficient documentation

## 2022-09-30 DIAGNOSIS — Z975 Presence of (intrauterine) contraceptive device: Secondary | ICD-10-CM | POA: Diagnosis present

## 2022-10-03 ENCOUNTER — Emergency Department (HOSPITAL_BASED_OUTPATIENT_CLINIC_OR_DEPARTMENT_OTHER)
Admission: EM | Admit: 2022-10-03 | Discharge: 2022-10-03 | Disposition: A | Discharge status: Home / Self Care | Payer: Medicaid Other | Attending: Emergency Medicine | Admitting: Emergency Medicine

## 2022-10-03 ENCOUNTER — Other Ambulatory Visit: Payer: Self-pay

## 2022-10-03 DIAGNOSIS — R040 Epistaxis: Secondary | ICD-10-CM | POA: Diagnosis present

## 2022-10-03 MED ORDER — OXYMETAZOLINE HCL 0.05 % NA SOLN
1.0000 | Freq: Once | NASAL | Status: AC
  Administered 2022-10-03: 1 via NASAL
  Filled 2022-10-03: qty 30

## 2022-10-03 NOTE — Discharge Instructions (Signed)
You can use Afrin for the next 2 days.  Do not blow your nose as that may dislodge a clot.  Follow-up with your primary care doctor.

## 2022-10-03 NOTE — ED Triage Notes (Signed)
Patient presents to ED via POV from home. Here due to nosebleed. No active bleeding at this time. Nosebleed lasted an estimated 30 minutes.

## 2022-10-03 NOTE — ED Provider Notes (Signed)
[facility] Provider Note   CSN: [id number] Arrival date & time: 10/03/22  1804     History  Chief Complaint  Patient presents with   Epistaxis    Cathy Roman is a 27 y.o. female.  27 year old female here today for right sided epistaxis.  Symptoms began this morning.  She intermittently has nosebleeds.  Symptoms had stopped by the time she arrived at the emergency department.   Epistaxis      Home Medications Prior to Admission medications   Medication Sig Start Date End Date Taking? Authorizing Provider  amLODipine (NORVASC) 10 MG tablet Take by mouth. 09/10/22 03/09/23  [provider]  atenolol (TENORMIN) 50 MG tablet Take 50 mg by mouth daily. 07/18/22   [provider]  buPROPion (WELLBUTRIN XL) 150 MG 24 hr tablet Take 150 mg by mouth daily.    [provider]  Erenumab-aooe 70 MG/ML SOAJ Inject into the skin. 07/13/22 07/13/23  [provider]  FLUoxetine (PROZAC) 20 MG capsule Take 20 mg by mouth daily. 12/03/20   [provider]  gabapentin (NEURONTIN) 300 MG capsule Take 300 mg by mouth at bedtime. 12/30/21 06/28/22  [provider]  guanFACINE (INTUNIV) 1 MG TB24 ER tablet Take 1 mg by mouth daily. 12/24/21   [provider]  ketorolac (TORADOL) 10 MG tablet Take 1 tablet (10 mg total) by mouth every 6 (six) hours as needed. 04/02/22   Venora Maples, MD  lamoTRIgine (LAMICTAL) 150 MG tablet Take 150 mg by mouth daily. 03/24/22   [provider]  SUMAtriptan (IMITREX) 50 MG tablet Take 50 mg by mouth every 2 (two) hours as needed. 03/08/22   [provider]  traZODone (DESYREL) 150 MG tablet Take 150 mg by mouth at bedtime.    [provider]  VYVANSE 70 MG capsule Take 70 mg by mouth daily. 12/08/21   [provider]      Allergies    Metoprolol and Sulfa antibiotics    Review of Systems   Review of Systems  HENT:   Positive for nosebleeds.     Physical Exam Updated Vital Signs BP 130/79 (BP Location: Left Arm)   Pulse 91   Temp 97.8 F (36.6 C) (Oral)   Resp 18   Ht 5\' 4"  (1.626 m)   Wt 91.6 kg   SpO2 100%   BMI 34.67 kg/m  Physical Exam Vitals reviewed.  HENT:     Nose: Nose normal. No congestion.     Comments: No culprit vessel. Cardiovascular:     Rate and Rhythm: Normal rate and regular rhythm.  Pulmonary:     Effort: Pulmonary effort is normal.  Musculoskeletal:     Cervical back: Normal range of motion.  Neurological:     Mental Status: She is alert.     ED Results / Procedures / Treatments   Labs (all labs ordered are listed, but only abnormal results are displayed) Labs Reviewed - No data to display  EKG None  Radiology No results found.  Procedures Procedures    Medications Ordered in ED Medications  oxymetazoline (AFRIN) 0.05 % nasal spray 1 spray (has no administration in time range)    ED Course/ Medical Decision Making/ A&P                                 Medical Decision Making 27 year old female here  today for epistaxis.  Plan-no bleeding oropharynx, no bleeding the anterior nasal pharynx.  Instruct the patient on ways to avoid nosebleeds, will give her some Afrin.  Will discharge home.  She is following up with her PCP on Tuesday.  Risk OTC drugs.           Final Clinical Impression(s) / ED Diagnoses Final diagnoses:  Epistaxis    Rx / DC Orders ED Discharge Orders     None         Arletha Pili, DO 10/03/22 1932

## 2022-12-28 ENCOUNTER — Encounter: Payer: Self-pay | Admitting: Family Medicine

## 2023-01-16 IMAGING — US US RENAL
1 series · 15 of 25 positions shown · non-contrast
Comparison: None.

CLINICAL DATA: Right flank pain, fever, tachycardia,
pyelonephritis, 30 weeks pregnant

EXAM:
RENAL / URINARY TRACT ULTRASOUND COMPLETE

[Series 1: us renal · 36 acquisitions, 15 frames shown]
[im 1/36]
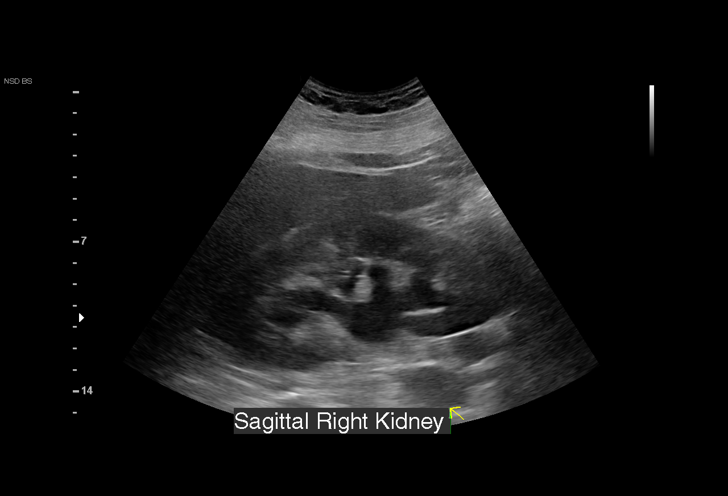
[im 3/36]
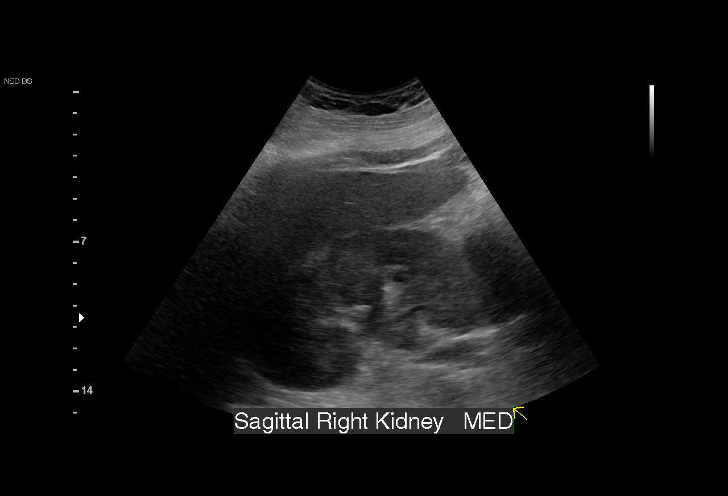
[im 6/36]
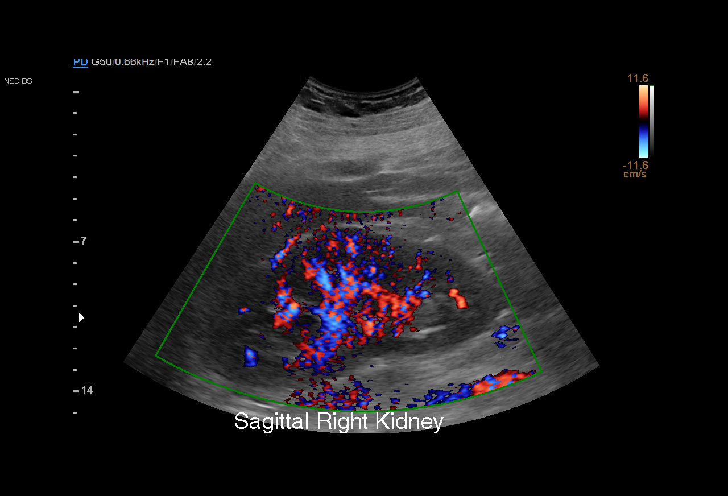
[im 8/36]
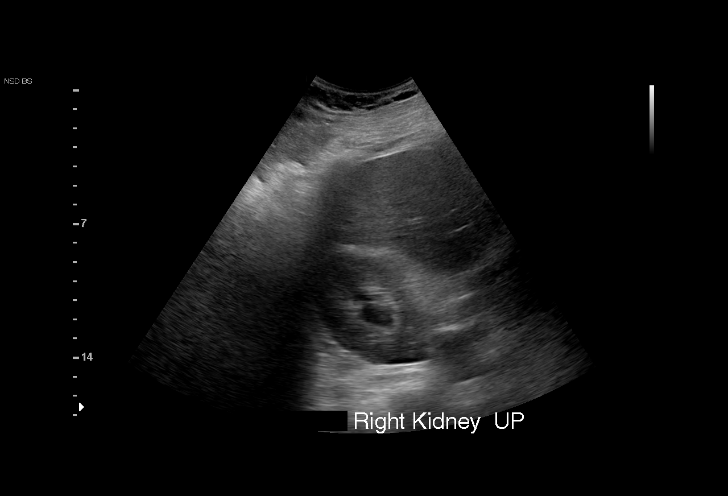
[im 11/36]
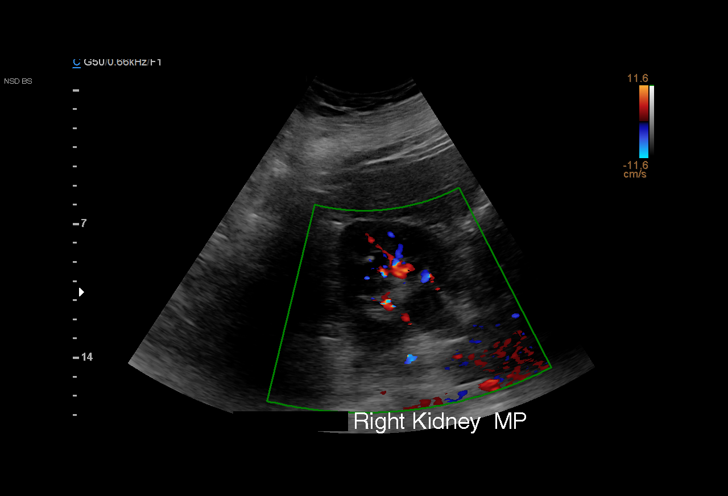
[im 14/36]
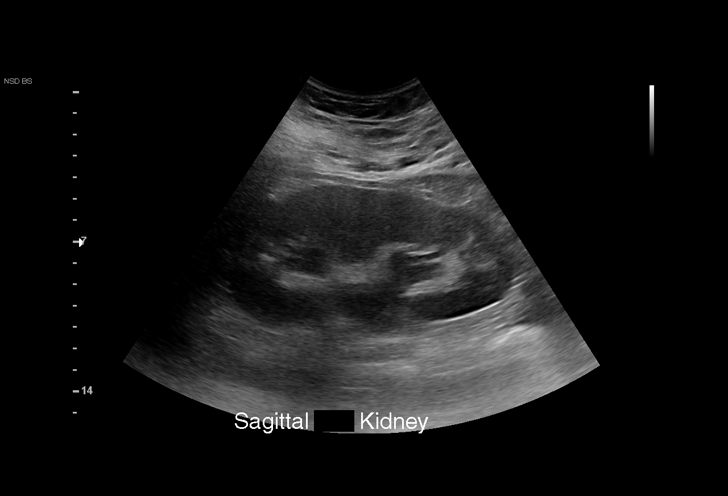
[im 15/36]
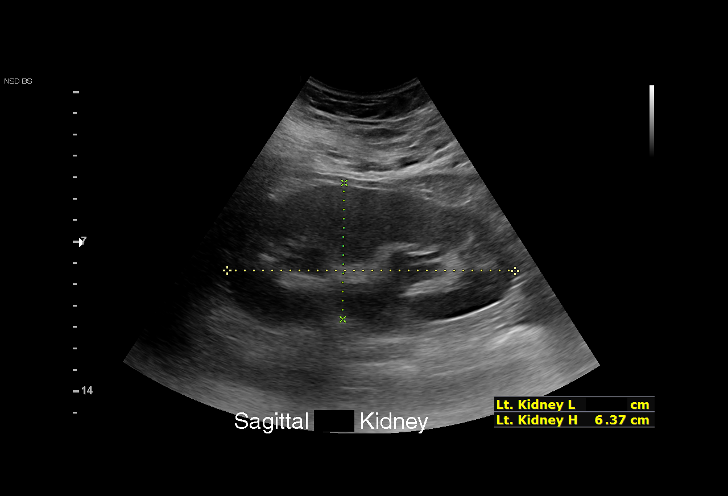
[im 18/36]
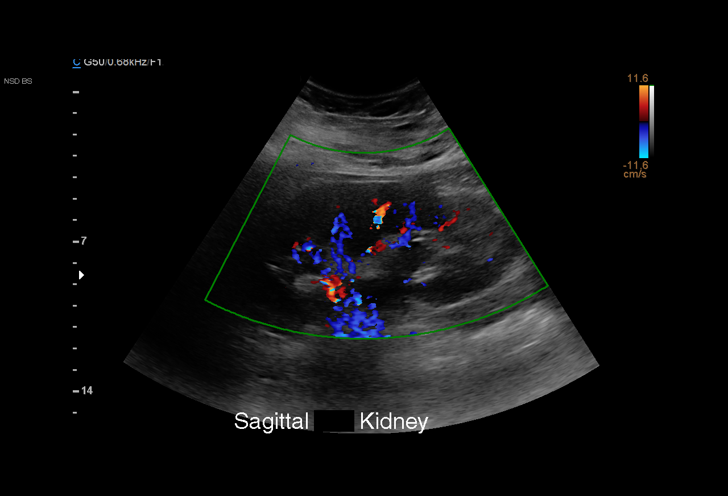
[im 21/36]
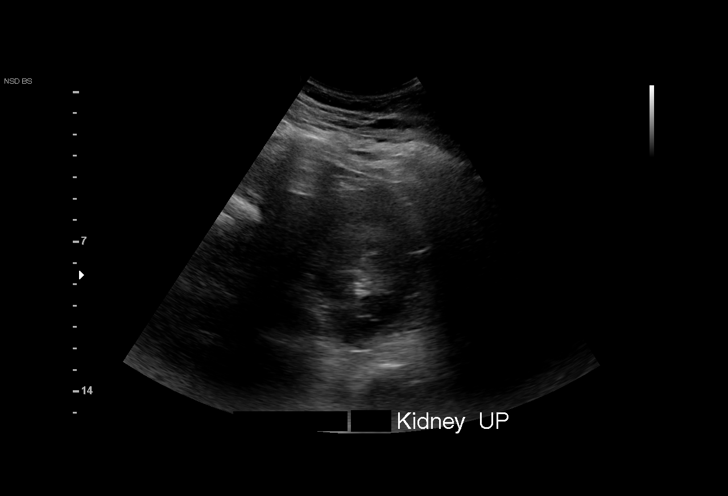
[im 22/36]
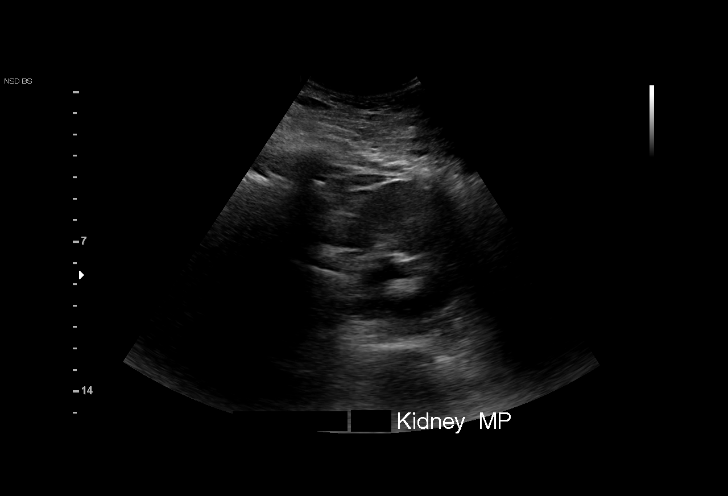
[im 25/36]
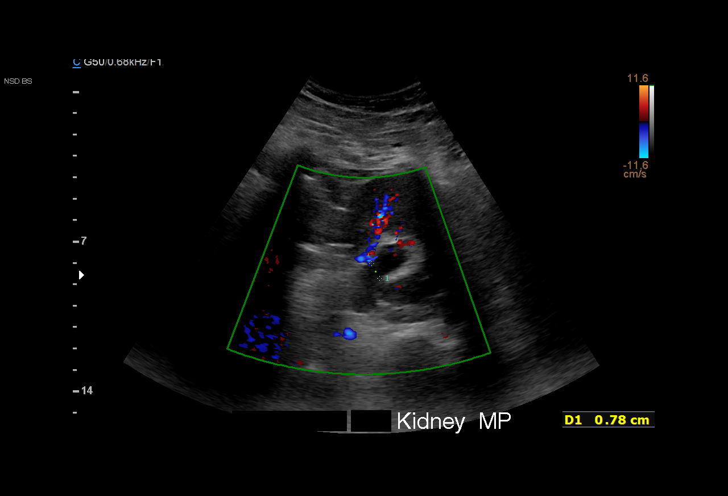
[im 28/36]
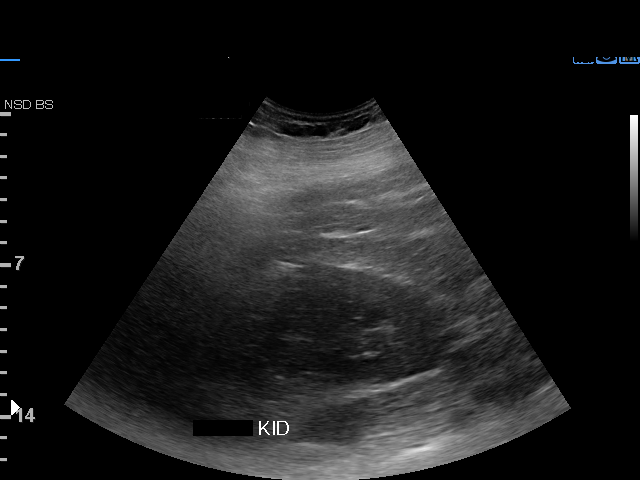
[im 30/36]
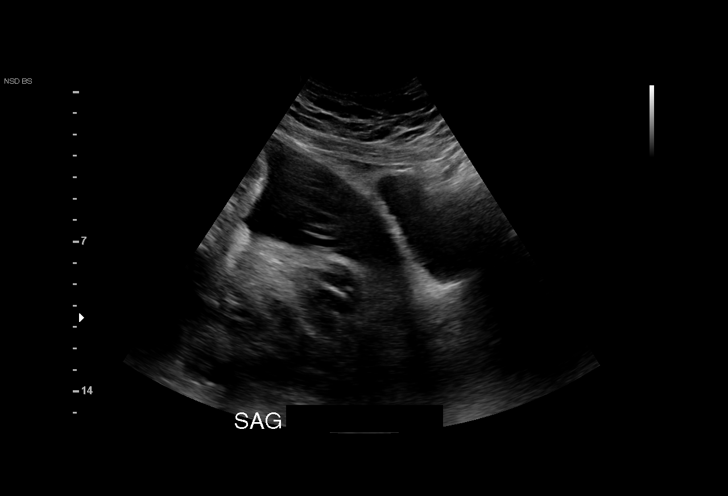
[im 33/36]
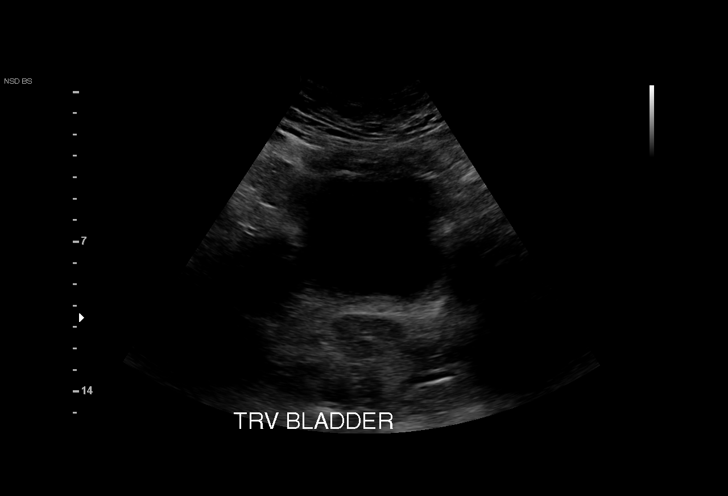
[im 36/36]
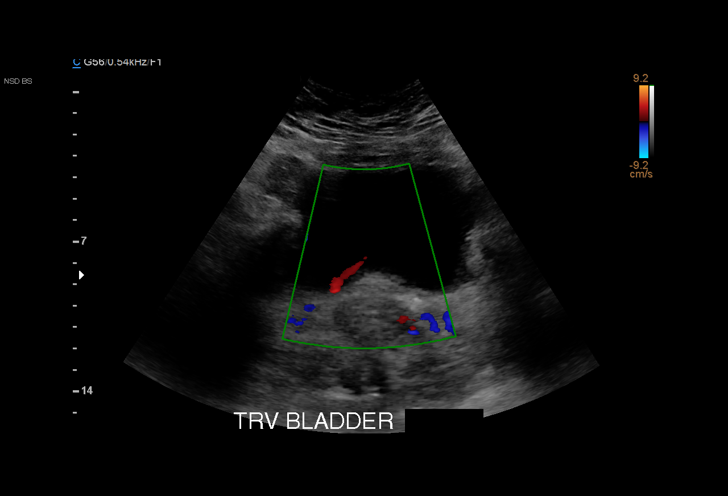

[15 of 25 positions shown; findings below may reference images not displayed]

FINDINGS: Right Kidney:

Renal measurements: 14.4 x 6.7 x 8.7 cm = volume: 437.1 mL. Renal
cortical echotexture is unremarkable. There is moderate
hydronephrosis. No nephrolithiasis.

Left Kidney:

Renal measurements: 13.4 x 6.4 by 6.1 cm = volume: 272.6 mL. Renal
cortical echotexture is unremarkable. There is moderate
hydronephrosis. No nephrolithiasis.

Bladder:

Appears normal for degree of bladder distention.

Other:

None.
IMPRESSION: 1. Moderate bilateral hydronephrosis, which could be due to mass
effect from gravid uterus. No evidence of nephrolithiasis.
2. Normal renal cortical echotexture.  No focal abnormalities.

## 2023-01-16 IMAGING — DX DG CHEST 1V PORT
2 series · 2 of 2 positions shown · non-contrast
Comparison: 11/10/2013

CLINICAL DATA: Dyspnea, history of tobacco abuse

EXAM:
PORTABLE CHEST 1 VIEW

[chest ap (1 of 2)]
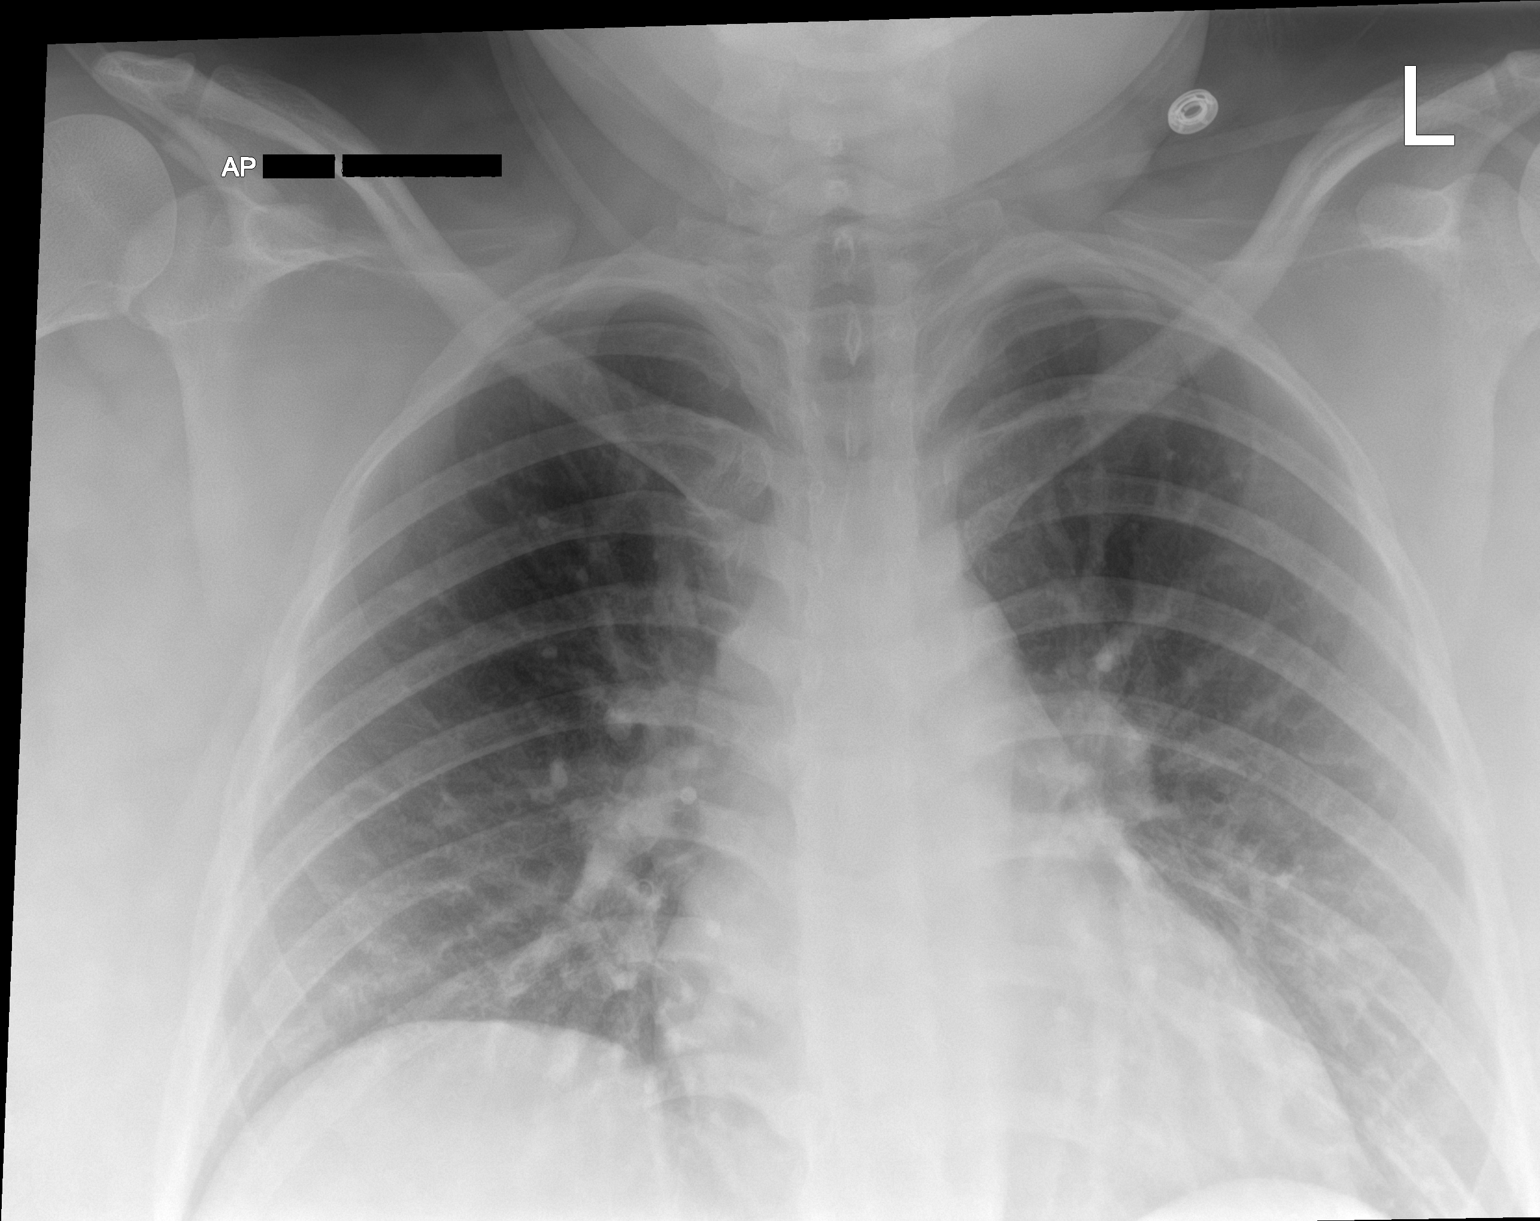

[chest ap (2 of 2)]
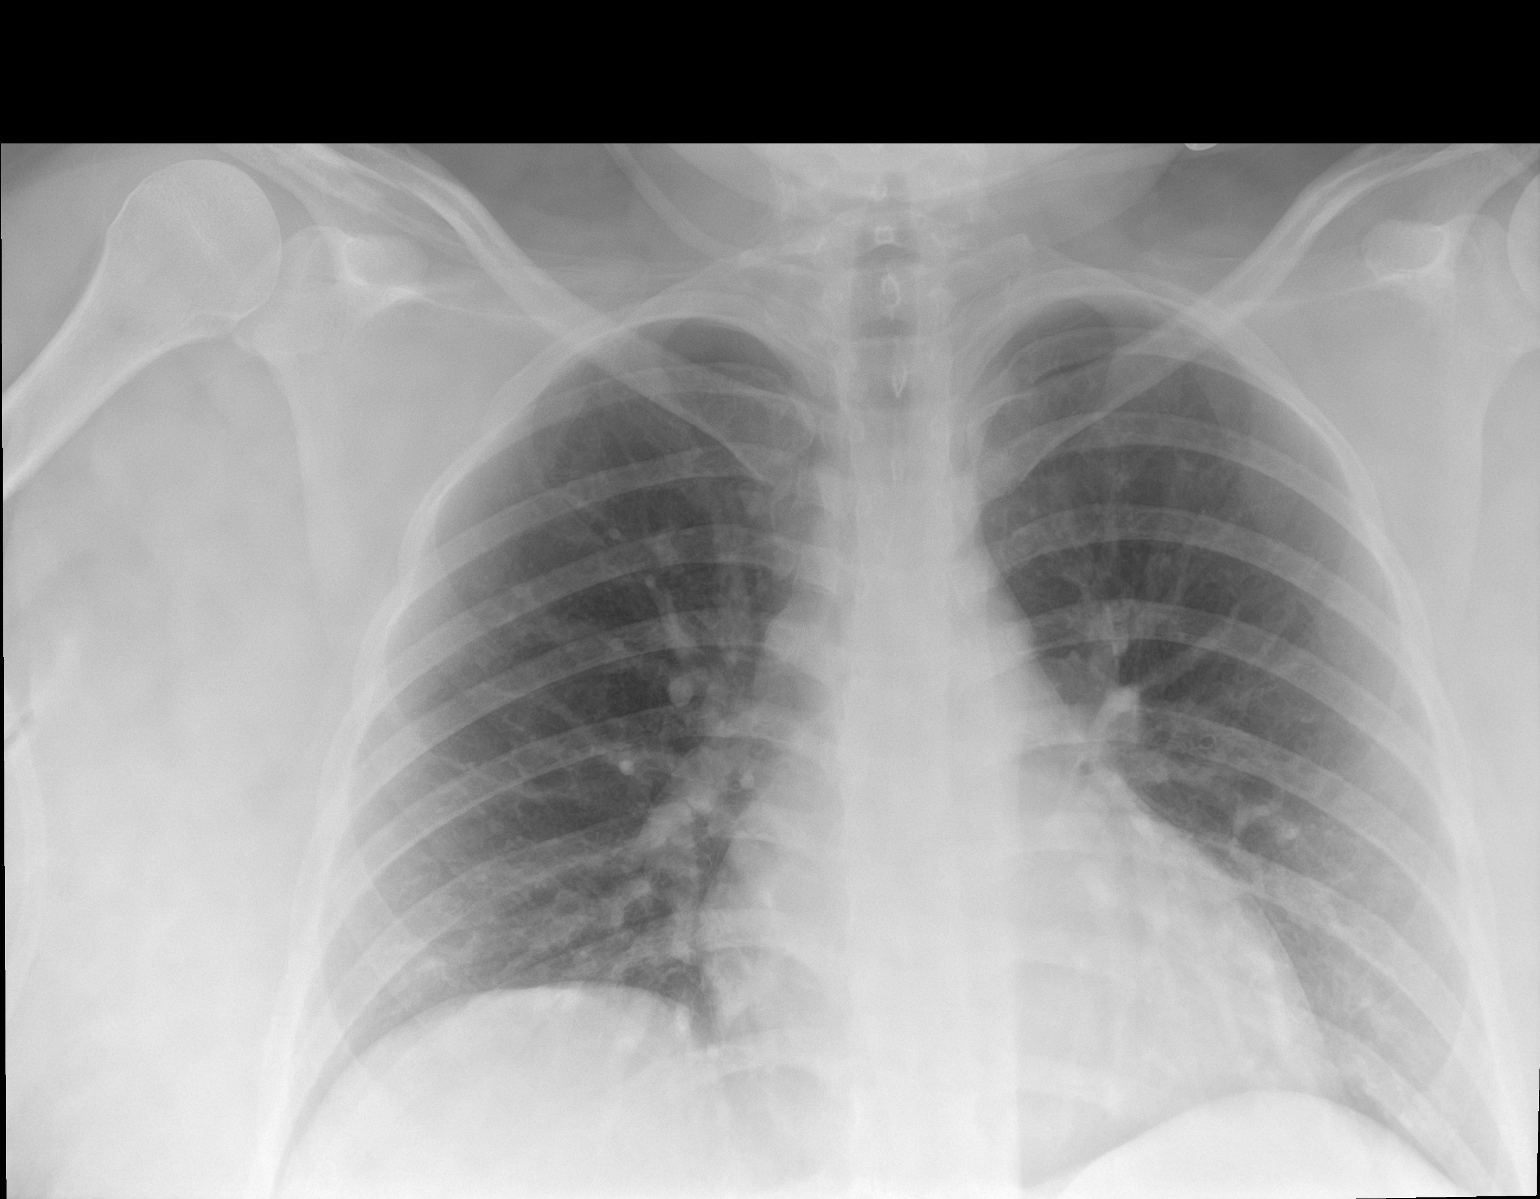

[2 of 2 positions shown; findings below may reference images not displayed]

FINDINGS: The heart size and mediastinal contours are within normal limits.
Both lungs are clear. The visualized skeletal structures are
unremarkable.
IMPRESSION: No active disease.

## 2023-01-18 IMAGING — US US MFM OB LIMITED
1 series · 11 of 11 positions shown · non-contrast
Comparison: none

[Series 1: us mfm ob limited · 11 acquisitions, 11 frames shown]
[im 1/11]
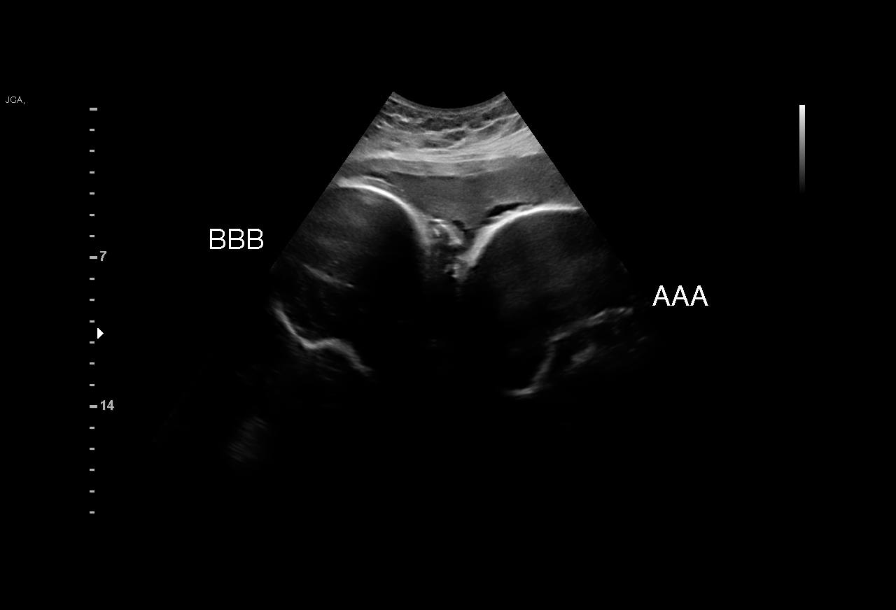
[im 2/11]
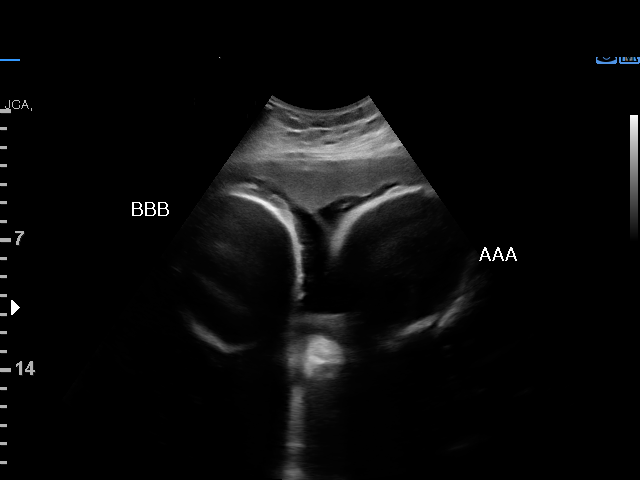
[im 3/11]
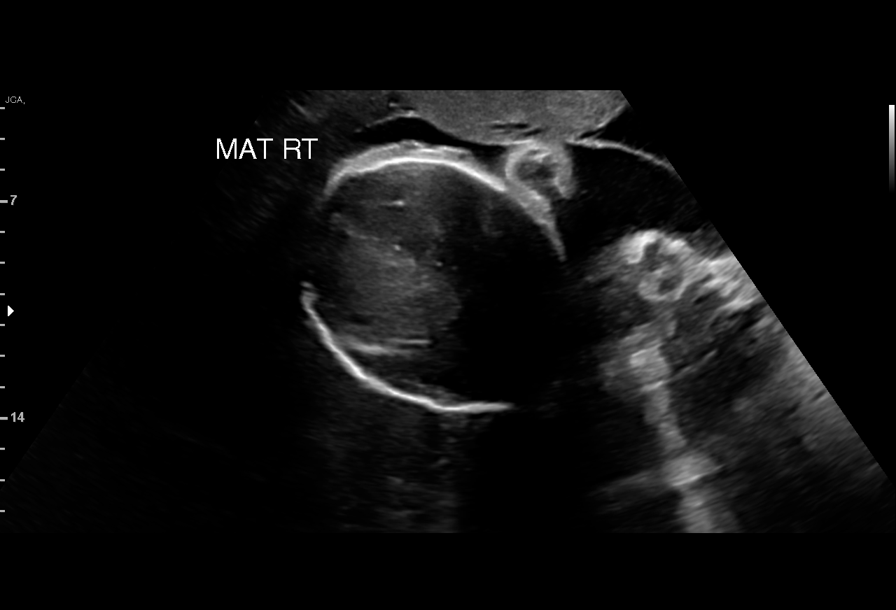
[im 4/11]
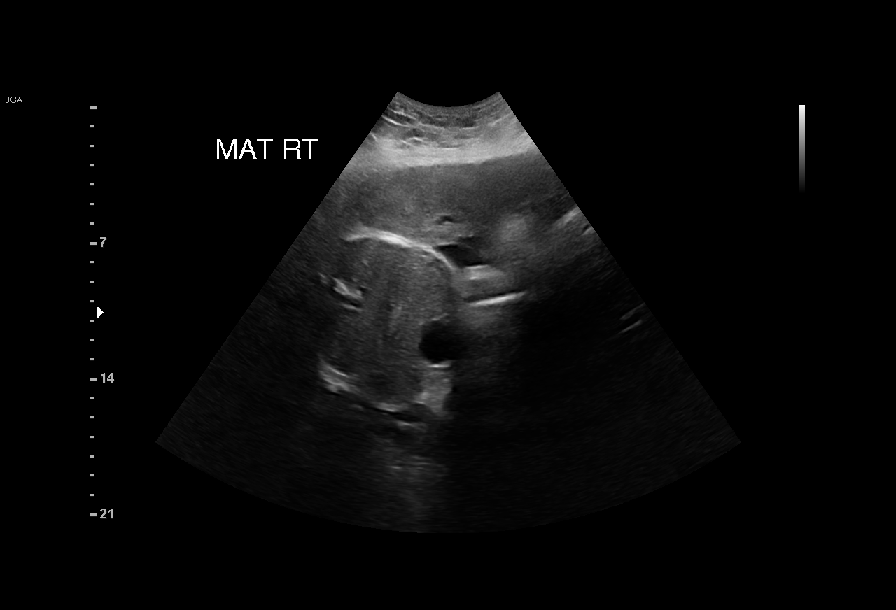
[im 5/11]
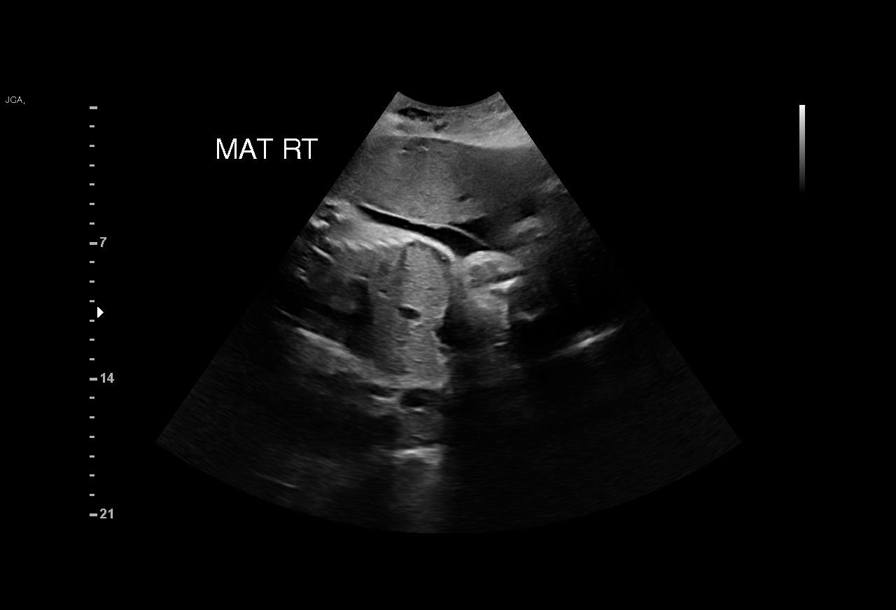
[im 6/11]
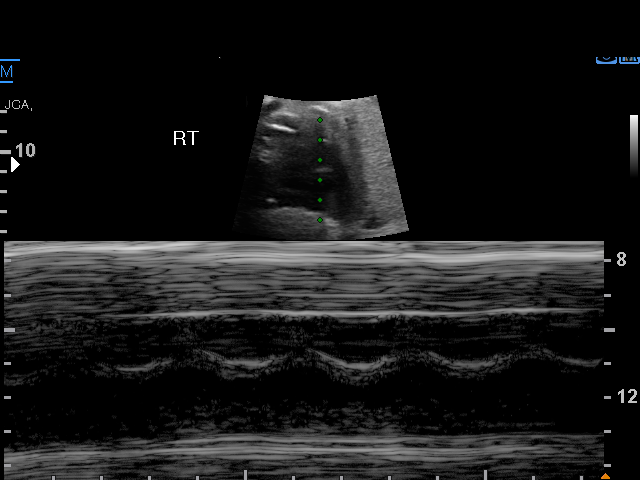
[im 7/11]
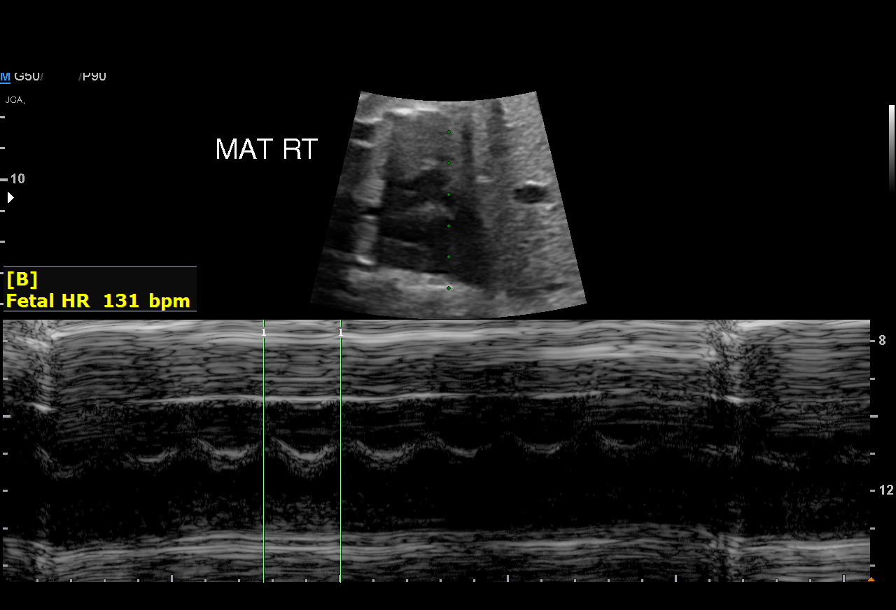
[im 8/11]
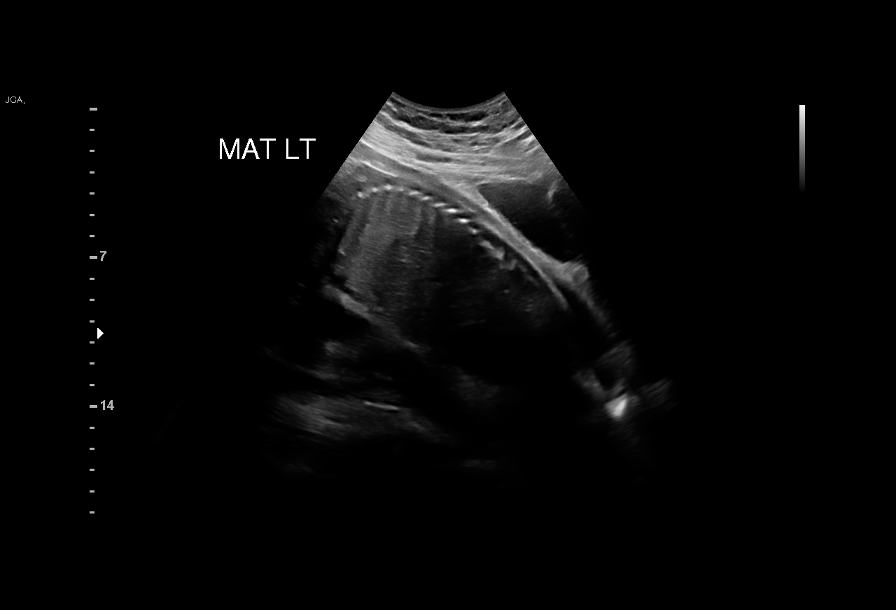
[im 9/11]
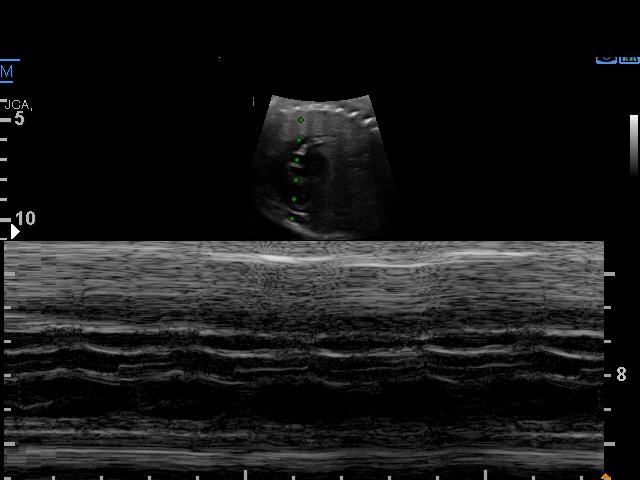
[im 10/11]
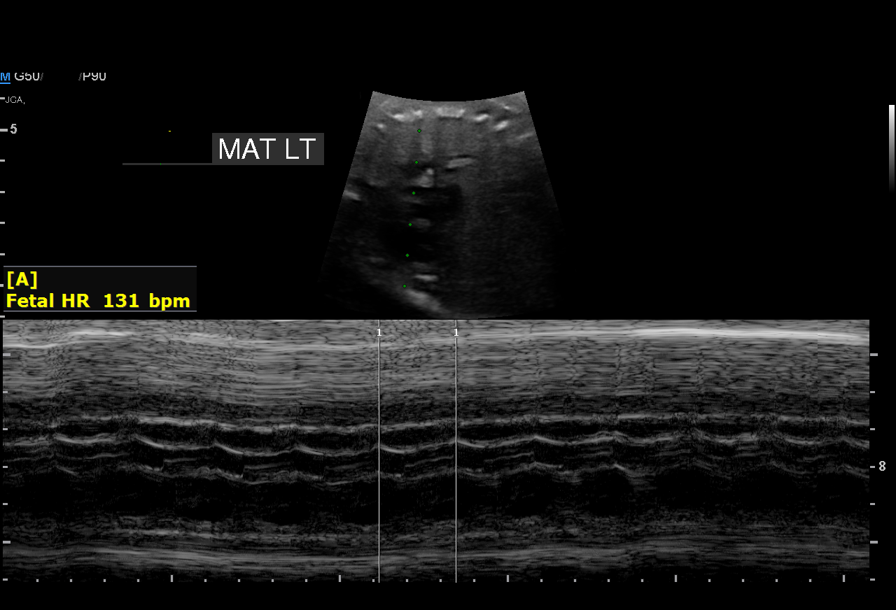
[im 11/11]
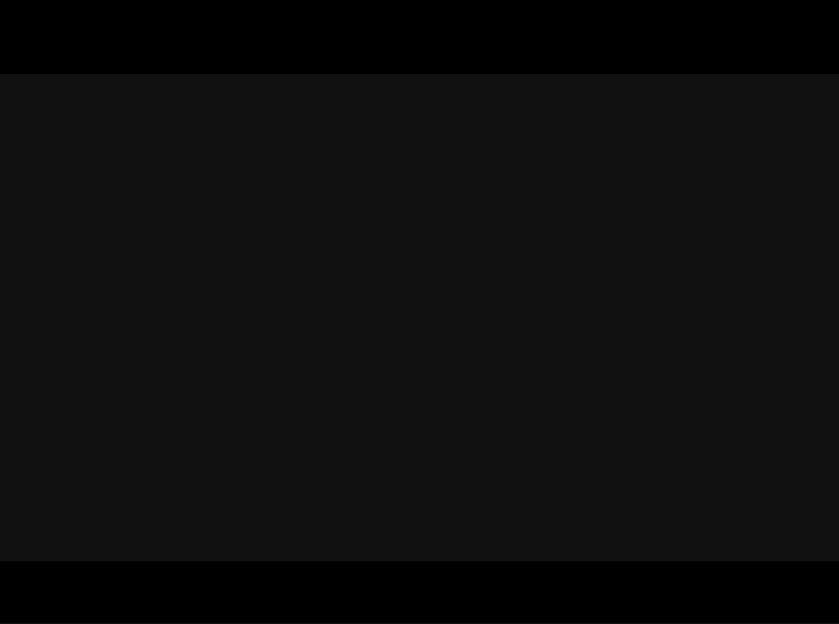

[11 of 11 positions shown; findings below may reference images not displayed]

Attending:        Son Keng Raquim        Secondary Phy.:   [REDACTED]
                   Care                                     [HOSPITAL]

 1  US MFM OB LIMITED                     76815.01    QUIRIJN AMAZIGH

Indications

 30 weeks gestation of pregnancy
 Vaginal bleeding in pregnancy, third trimester
 Twin pregnancy, di/di, third trimester
Fetal Evaluation (Fetus A)

 Num Of Fetuses:         2
 Fetal Heart Rate(bpm):  131
 Cardiac Activity:       Observed
 Fetal Lie:              Lower Fetus Mat Lt
 Presentation:           Breech
 Placenta:               Anterior
 P. Cord Insertion:      Not well visualized
 Membrane Desc:      Dividing Membrane seen - Dichorionic.
Biometry (Fetus A)

 LV:        6.1  mm
OB History

 Gravidity:    1         Term:   0
 Living:       0
Gestational Age (Fetus A)

 LMP:           30w 4d        Date:  10/20/19                 EDD:   07/26/20
 Best:          30w 4d     Det. By:  LMP  (10/20/19)          EDD:   07/26/20
Fetal Evaluation (Fetus B)
 Num Of Fetuses:         2
 Fetal Heart Rate(bpm):  131
 Cardiac Activity:       Observed
 Fetal Lie:              Upper Fetus Mat Rt
 Presentation:           Breech
 Placenta:               Posterior
 P. Cord Insertion:      Not well visualized
 Membrane Desc:      Dividing Membrane seen - Dichorionic.
Gestational Age (Fetus B)

 LMP:           30w 4d        Date:  10/20/19                 EDD:   07/26/20
 Best:          30w 4d     Det. By:  LMP  (10/20/19)          EDD:   07/26/20
Impression

 Patient with twin pregnancy was admitted with acute
 pyelonephritis. She also had vaginal bleeding.
 A limited ultrasound study was performed.
 Dichorionic-diamniotic twin pregnancy.
 Twin A: Lower fetus, maternal left, breech presentation,
 anterior placenta. Amniotic fluid is normal and good fetal
 activity is seen. Placenta appears normal with no
 retroplacental hemorrhage.
 Twin B: Upper fetus, maternal right, breech presentation,
 posterior placenta. Amniotic fluid is normal and good fetal
 activity is seen.
 Patient opted not to have transvaginal ultrasound to evaluate
 the cervix.
                      RITA SOFIA VAN DUNEM

## 2023-02-02 ENCOUNTER — Encounter: Payer: Self-pay | Admitting: Family Medicine

## 2023-02-16 ENCOUNTER — Ambulatory Visit: Payer: Medicaid Other | Admitting: Family Medicine

## 2023-02-16 ENCOUNTER — Other Ambulatory Visit: Payer: Self-pay

## 2023-02-16 ENCOUNTER — Encounter: Payer: Self-pay | Admitting: Family Medicine

## 2023-02-16 VITALS — BP 138/93 | HR 54 | Wt 201.0 lb

## 2023-02-16 DIAGNOSIS — Z3202 Encounter for pregnancy test, result negative: Secondary | ICD-10-CM

## 2023-02-16 DIAGNOSIS — Z975 Presence of (intrauterine) contraceptive device: Secondary | ICD-10-CM

## 2023-02-16 DIAGNOSIS — Z1331 Encounter for screening for depression: Secondary | ICD-10-CM

## 2023-02-16 DIAGNOSIS — T8332XA Displacement of intrauterine contraceptive device, initial encounter: Secondary | ICD-10-CM | POA: Insufficient documentation

## 2023-02-16 LAB — POCT URINALYSIS DIP (DEVICE)
Bilirubin Urine: NEGATIVE
Glucose, UA: NEGATIVE mg/dL
Hgb urine dipstick: NEGATIVE
Ketones, ur: NEGATIVE mg/dL
Leukocytes,Ua: NEGATIVE
Nitrite: NEGATIVE
Protein, ur: NEGATIVE mg/dL
Specific Gravity, Urine: 1.025 (ref 1.005–1.030)
Urobilinogen, UA: 0.2 mg/dL (ref 0.0–1.0)
pH: 7.5 (ref 5.0–8.0)

## 2023-02-16 LAB — POCT PREGNANCY, URINE: Preg Test, Ur: NEGATIVE

## 2023-02-16 NOTE — Progress Notes (Signed)
GYNECOLOGY OFFICE VISIT NOTE  History:   Cathy Roman is a 28 y.o. [phone] here today for IUD check.  I last saw patient on 04/02/2022 for an uncomplicated IUD removal and re-insertion due to displacement into the LUS [place] on 09/22/2022 for breakthrough bleeding and to ensure well seated, no concern at that visit She contacted the clinic earlier this month as she was unable to feel the strings and scheduled this visit  Primary concern is IUD not being in place, has not been able to feel the strings Has also had some spotting which is unusual for her as she has had amenorrhea the whole time she has had the IUD up until recently Wondering about getting a UA as well  Health Maintenance Due  Topic Date Due   HIV Screening  Never done   Hepatitis C Screening  Never done   INFLUENZA VACCINE  Never done   COVID-19 Vaccine (1 - 2024-25 season) Never done    Past Medical History:  Diagnosis Date   ADHD    ADD   Anxiety disorder    Chronic migraine without aura, intractable, without status migrainosus    Depression    Dichorionic diamniotic twin pregnancy in third trimester 05/19/2020   E coli bacteremia    Elevated blood-pressure reading without diagnosis of hypertension 05/28/2021   Former cigarette smoker    Psychiatric disorder    Pyelonephritis     Past Surgical History:  Procedure Laterality Date   CESAREAN SECTION  05/21/2020   Procedure: CESAREAN SECTION;  Surgeon: Warden Fillers, MD;  Location: [facility];  Service: Obstetrics;;   NONE      The following portions of the patient's history were reviewed and updated as appropriate: allergies, current medications, past family history, past medical history, past social history, past surgical history and problem list.   Health Maintenance:   Last pap: No results found for: "DIAGPAP", "HPV", "HPVHIGH" 06/01/2022 - NILM, neg HPV, in Care Everywhere. Results abstracted to [facility]  Last mammogram:  N/a    Review  of Systems:  Pertinent items noted in HPI and remainder of comprehensive ROS otherwise negative.  Physical Exam:  BP (!) 138/93   Pulse (!) 54   Wt 201 lb (91.2 kg)   BMI 34.50 kg/m  CONSTITUTIONAL: Well-developed, well-nourished female in no acute distress.  HEENT:  Normocephalic, atraumatic. External right and left ear normal. No scleral icterus.  NECK: Normal range of motion, supple, no masses noted on observation SKIN: No rash noted. Not diaphoretic. No erythema. No pallor. MUSCULOSKELETAL: Normal range of motion. No edema noted. NEUROLOGIC: Alert and oriented to person, place, and time. Normal muscle tone coordination.  PSYCHIATRIC: Normal mood and affect. Normal behavior. Normal judgment and thought content. RESPIRATORY: Effort normal, no problems with respiration noted PELVIC: Normal appearing external genitalia; normal appearing vaginal mucosa and cervix.  No abnormal discharge noted. No IUD strings visualized  Labs and Imaging Results for orders placed or performed in visit on 02/16/23 (from the past week)  POCT urinalysis dip (device)   Collection Time: 02/16/23  4:03 PM  Result Value Ref Range   Glucose, UA NEGATIVE NEGATIVE mg/dL   Bilirubin Urine NEGATIVE NEGATIVE   Ketones, ur NEGATIVE NEGATIVE mg/dL   Specific Gravity, Urine 1.025 1.005 - 1.030   Hgb urine dipstick NEGATIVE NEGATIVE   pH 7.5 5.0 - 8.0   Protein, ur NEGATIVE NEGATIVE mg/dL   Urobilinogen, UA 0.2 0.0 - 1.0 mg/dL  Nitrite NEGATIVE NEGATIVE   Leukocytes,Ua NEGATIVE NEGATIVE  Pregnancy, urine POC   Collection Time: 02/16/23  4:08 PM  Result Value Ref Range   Preg Test, Ur NEGATIVE NEGATIVE   No results found.    Assessment and Plan:   Problem List Items Addressed This Visit       Other   IUD (intrauterine device) in place   IUD threads lost - Primary   Unable to visualize strings, attempted to tease them out with cyto brush from cervical os without success. Will send for [place]. UPT negative  today, patient understands she is at risk for pregnancy until we locate IUD or start alternative method.   UA obtained at her request was unremarkable.       Relevant Orders   US PELVIC COMPLETE WITH TRANSVAGINAL    Routine preventative health maintenance measures emphasized. Please refer to After Visit Summary for other counseling recommendations.   Return for pending results.    Total face-to-face time with patient: 15 minutes.  Over 50% of encounter was spent on counseling and coordination of care.   Venora Maples, MD/MPH Attending Family Medicine Physician, [facility] for [facility], [facility]

## 2023-02-16 NOTE — Assessment & Plan Note (Addendum)
Unable to visualize strings, attempted to tease them out with cyto brush from cervical os without success. Will send for [place]. UPT negative today, patient understands she is at risk for pregnancy until we locate IUD or start alternative method.   UA obtained at her request was unremarkable.

## 2023-02-17 ENCOUNTER — Ambulatory Visit (HOSPITAL_COMMUNITY)
Admission: RE | Admit: 2023-02-17 | Discharge: 2023-02-17 | Disposition: A | Discharge status: Home / Self Care | Payer: Medicaid Other | Source: Ambulatory Visit | Attending: Family Medicine | Admitting: Family Medicine

## 2023-02-17 DIAGNOSIS — T8332XA Displacement of intrauterine contraceptive device, initial encounter: Secondary | ICD-10-CM | POA: Insufficient documentation

## 2023-02-18 ENCOUNTER — Encounter: Payer: Self-pay | Admitting: Family Medicine

## 2023-02-25 ENCOUNTER — Encounter (HOSPITAL_BASED_OUTPATIENT_CLINIC_OR_DEPARTMENT_OTHER): Payer: Self-pay

## 2023-02-25 ENCOUNTER — Ambulatory Visit (HOSPITAL_BASED_OUTPATIENT_CLINIC_OR_DEPARTMENT_OTHER)
Admission: RE | Admit: 2023-02-25 | Discharge: 2023-02-25 | Disposition: A | Discharge status: Home / Self Care | Payer: Medicaid Other | Source: Ambulatory Visit | Attending: Internal Medicine | Admitting: Internal Medicine

## 2023-02-25 VITALS — BP 133/81 | HR 64 | Temp 98.2°F | Resp 20

## 2023-02-25 DIAGNOSIS — R82998 Other abnormal findings in urine: Secondary | ICD-10-CM

## 2023-02-25 DIAGNOSIS — R300 Dysuria: Secondary | ICD-10-CM

## 2023-02-25 DIAGNOSIS — N898 Other specified noninflammatory disorders of vagina: Secondary | ICD-10-CM

## 2023-02-25 LAB — POCT URINALYSIS DIP (MANUAL ENTRY)
Bilirubin, UA: NEGATIVE
Blood, UA: NEGATIVE
Glucose, UA: NEGATIVE mg/dL
Ketones, POC UA: NEGATIVE mg/dL
Nitrite, UA: NEGATIVE
Protein Ur, POC: NEGATIVE mg/dL
Spec Grav, UA: 1.025 (ref 1.010–1.025)
Urobilinogen, UA: 0.2 U/dL
pH, UA: 7 (ref 5.0–8.0)

## 2023-02-25 MED ORDER — NITROFURANTOIN MONOHYD MACRO 100 MG PO CAPS: 100.0000 mg | ORAL_CAPSULE | Freq: Two times a day (BID) | ORAL | 0 refills | Status: DC

## 2023-02-25 NOTE — Discharge Instructions (Signed)
 There are a few white  cells in the urine so I am sending it for a culture, but in the mean time I sent an antibiotic until the results are back. We will call you if you need to make any changes to your medication.   We will call you if the swab comes back positive.

## 2023-02-25 NOTE — ED Triage Notes (Signed)
 Vaginal discharge, itching x 2 days. No urinary symptoms.

## 2023-02-25 NOTE — ED Provider Notes (Signed)
 [facility]    CSN: [id number] Arrival date & time: 02/25/23  1701      History   Chief Complaint Chief Complaint  Patient presents with   Vaginal Discharge    HPI Cathy Roman is a 28 y.o. female presents with thin yellow discharge with odor x 2 days. Denies itching   Has been having mild dysuria x 2 days, denies fever, flank pain or hematuria.  Her last UTI was last year and only had one in the past 12 months. Does not get periods with her IUD.  Has been a little constipated  Past Medical History:  Diagnosis Date   ADHD    ADD   Anxiety disorder    Chronic migraine without aura, intractable, without status migrainosus    Depression    Dichorionic diamniotic twin pregnancy in third trimester 05/19/2020   E coli bacteremia    Elevated blood-pressure reading without diagnosis of hypertension 05/28/2021   Former cigarette smoker    Psychiatric disorder    Pyelonephritis     Patient Active Problem List   Diagnosis Date Noted   IUD threads lost 02/16/2023   IUD (intrauterine device) in place 04/29/2021   History of cesarean delivery 05/21/2020   BMI 40.0-44.9, adult (HCC) 05/20/2020   Major depressive disorder, single episode, severe without psychotic features (HCC) 02/28/2014    Past Surgical History:  Procedure Laterality Date   CESAREAN SECTION  05/21/2020   Procedure: CESAREAN SECTION;  Surgeon: Zina Jerilynn LABOR, MD;  Location: [facility]S;  Service: Obstetrics;;   NONE      OB History     Gravida  2   Para  1   Term      Preterm  1   AB      Living  2      SAB      IAB      Ectopic      Multiple  1   Live Births  2            Home Medications    Prior to Admission medications   Medication Sig Start Date End Date Taking? Authorizing Provider  nitrofurantoin , macrocrystal-monohydrate, (MACROBID ) 100 MG capsule Take 1 capsule (100 mg total) by mouth 2 (two) times daily. 02/25/23  Yes Rodriguez-Southworth, Pilar Corrales, PA-C   amLODipine (NORVASC) 10 MG tablet Take by mouth. 09/10/22 03/09/23  [provider]  atenolol (TENORMIN) 50 MG tablet Take 50 mg by mouth daily. 07/18/22   [provider]  buPROPion  (WELLBUTRIN  XL) 150 MG 24 hr tablet Take 150 mg by mouth daily.    [provider]  Erenumab-aooe 70 MG/ML SOAJ Inject into the skin. 07/13/22 07/13/23  [provider]  FLUoxetine (PROZAC) 20 MG capsule Take 20 mg by mouth daily. 12/03/20   [provider]  gabapentin (NEURONTIN) 300 MG capsule Take 300 mg by mouth at bedtime. 12/30/21 06/28/22  [provider]  gabapentin (NEURONTIN) 300 MG capsule Take by mouth. 07/22/22   [provider]  guanFACINE (INTUNIV) 1 MG TB24 ER tablet Take 1 mg by mouth daily. 12/24/21   [provider]  lamoTRIgine (LAMICTAL) 150 MG tablet Take 150 mg by mouth daily. 03/24/22   [provider]  SUMAtriptan (IMITREX) 50 MG tablet Take 50 mg by mouth every 2 (two) hours as needed. 03/08/22   [provider]  traZODone  (DESYREL ) 150 MG tablet Take 150 mg by mouth at bedtime.    [provider]  VYVANSE 70 MG capsule Take 70 mg by mouth daily. 12/08/21   [provider]    Family History Family History  Problem Relation Age of Onset   Mental illness Neg Hx     Social History Social History   Tobacco Use   Smoking status: Former   Smokeless tobacco: Never  Vaping Use   Vaping status: Every Day  Substance Use Topics   Alcohol use: No   Drug use: No     Allergies   Metoprolol and Sulfa antibiotics   Review of Systems Review of Systems As noted in HPI  Physical Exam Triage Vital Signs ED Triage Vitals  Encounter Vitals Group     BP 02/25/23 1727 133/81     Systolic BP Percentile --      Diastolic BP Percentile --      Pulse Rate 02/25/23 1727 64     Resp 02/25/23 1727 20     Temp 02/25/23 1727 98.2 F (36.8 C)     Temp src --      SpO2 02/25/23 1727 98 %      Weight --      Height --      Head Circumference --      Peak Flow --      Pain Score 02/25/23 1728 1     Pain Loc --      Pain Education --      Exclude from Growth Chart --    No data found.  Updated Vital Signs BP 133/81 (BP Location: Right Arm)   Pulse 64   Temp 98.2 F (36.8 C)   Resp 20   SpO2 98%   Visual Acuity Right Eye Distance:   Left Eye Distance:   Bilateral Distance:    Right Eye Near:   Left Eye Near:    Bilateral Near:     Physical Exam Physical Exam Vitals and nursing note reviewed.  Constitutional:      General: She is not in acute distress.    Appearance: She is not toxic-appearing.  HENT:     Head: Normocephalic.     Right Ear: External ear normal.     Left Ear: External ear normal.  Eyes:     General: No scleral icterus.    Conjunctiva/sclera: Conjunctivae normal.  Pulmonary:     Effort: Pulmonary effort is normal.  Abdominal:     General: Bowel sounds are normal.     Palpations: Abdomen is soft. There is no mass.     Tenderness: There is no guarding or rebound.     Comments: - CVA tenderness   Musculoskeletal:        General: Normal range of motion.     Cervical back: Neck supple.      Skin:    General: Skin is warm and dry.     Findings: No rash.  Neurological:     Mental Status: She is alert and oriented to person, place, and time.     Gait: Gait normal.  Psychiatric:        Mood and Affect: Mood normal.        Behavior: Behavior normal.        Thought Content: Thought content normal.        Judgment: Judgment normal.    UC Treatments / Results  Labs (all labs ordered are listed, but only abnormal results are displayed) Labs Reviewed  POCT URINALYSIS DIP (MANUAL ENTRY) - Abnormal; Notable for the  following components:      Result Value   Clarity, UA turbid (*)    Leukocytes, UA Small (1+) (*)    All other components within normal limits  URINE CULTURE  CERVICOVAGINAL ANCILLARY ONLY    EKG   Radiology No results  found.  Procedures Procedures (including critical care time)  Medications Ordered in UC Medications - No data to display  Initial Impression / Assessment and Plan / UC Course  I have reviewed the triage vital signs and the nursing notes.  Pertinent labs  results that were available during my care of the patient were reviewed by me and considered in my medical decision making (see chart for details).  Dysuria Vaginal discharge  Urine sent out for a culture but in the mean time I started her on Macrobid  as noted We will call her with vaginal swab results if positive    Final Clinical Impressions(s) / UC Diagnoses   Final diagnoses:  Vaginal discharge  Dysuria  Leukocytes in urine     Discharge Instructions      There are a few white  cells in the urine so I am sending it for a culture, but in the mean time I sent an antibiotic until the results are back. We will call you if you need to make any changes to your medication.   We will call you if the swab comes back positive.      ED Prescriptions     Medication Sig Dispense Auth. Provider   nitrofurantoin , macrocrystal-monohydrate, (MACROBID ) 100 MG capsule Take 1 capsule (100 mg total) by mouth 2 (two) times daily. 10 capsule Rodriguez-Southworth, Loriene Taunton, PA-C      PDMP not reviewed this encounter.   Lindi Carter, PA-C 02/25/23 1801

## 2023-02-28 LAB — URINE CULTURE
Culture: NO GROWTH
Special Requests: NORMAL

## 2023-03-01 ENCOUNTER — Telehealth (HOSPITAL_COMMUNITY): Payer: Self-pay

## 2023-03-01 LAB — CERVICOVAGINAL ANCILLARY ONLY
Bacterial Vaginitis (gardnerella): POSITIVE — AB
Candida Glabrata: NEGATIVE
Candida Vaginitis: NEGATIVE
Chlamydia: NEGATIVE
Comment: NEGATIVE
Comment: NEGATIVE
Comment: NEGATIVE
Comment: NEGATIVE
Comment: NEGATIVE
Comment: NORMAL
Neisseria Gonorrhea: NEGATIVE
Trichomonas: NEGATIVE

## 2023-03-01 MED ORDER — METRONIDAZOLE 500 MG PO TABS: 500.0000 mg | ORAL_TABLET | Freq: Two times a day (BID) | ORAL | 0 refills | Status: AC

## 2023-03-01 NOTE — Telephone Encounter (Signed)
 Per protocol, pt requires tx with metronidazole. Rx sent to pharmacy on file.

## 2023-03-16 ENCOUNTER — Encounter (HOSPITAL_BASED_OUTPATIENT_CLINIC_OR_DEPARTMENT_OTHER): Payer: Self-pay

## 2023-03-16 ENCOUNTER — Ambulatory Visit (HOSPITAL_BASED_OUTPATIENT_CLINIC_OR_DEPARTMENT_OTHER)
Admission: EM | Admit: 2023-03-16 | Discharge: 2023-03-16 | Disposition: A | Discharge status: Home / Self Care | Payer: Medicaid Other | Attending: Physician Assistant | Admitting: Physician Assistant

## 2023-03-16 ENCOUNTER — Ambulatory Visit (HOSPITAL_BASED_OUTPATIENT_CLINIC_OR_DEPARTMENT_OTHER): Payer: Medicaid Other | Admitting: Radiology

## 2023-03-16 DIAGNOSIS — W1843XA Slipping, tripping and stumbling without falling due to stepping from one level to another, initial encounter: Secondary | ICD-10-CM

## 2023-03-16 DIAGNOSIS — M25571 Pain in right ankle and joints of right foot: Secondary | ICD-10-CM

## 2023-03-16 DIAGNOSIS — M79671 Pain in right foot: Secondary | ICD-10-CM | POA: Diagnosis not present

## 2023-03-16 DIAGNOSIS — W19XXXA Unspecified fall, initial encounter: Secondary | ICD-10-CM | POA: Diagnosis not present

## 2023-03-16 MED ORDER — KETOROLAC TROMETHAMINE 30 MG/ML IJ SOLN
30.0000 mg | Freq: Once | INTRAMUSCULAR | Status: AC
  Administered 2023-03-16: 30 mg via INTRAMUSCULAR

## 2023-03-16 MED ORDER — IBUPROFEN 800 MG PO TABS: 800.0000 mg | ORAL_TABLET | Freq: Three times a day (TID) | ORAL | 0 refills | Status: DC

## 2023-03-16 NOTE — ED Notes (Signed)
 Ice pack provided to patient. Right ankle appearing to become discolored. Spouse assisting patient into bathroom.

## 2023-03-16 NOTE — ED Provider Notes (Signed)
 [facility]    CSN: [id number] Arrival date & time: 03/16/23  1027      History   Chief Complaint Chief Complaint  Patient presents with   Ankle Injury    HPI Cathy Roman is a 28 y.o. female.   Patient presents today with a several hour history of right ankle pain.  Reports that she was dosed with step this morning causing her to traumatically invert her foot/ankle causing extreme pain.  She reports that pain is rated 9/10 on a 0-10 pain scale, localized to lateral ankle and foot, described as sharp, worse with palpation or movement, no alleviating factors identified.  She does report some numbness in the foot.  Denies any paresthesias.  She has previously injured her ankle multiple times in the past but has never had any surgery.  She has tried Tylenol without improvement of symptoms.  She has no concern for pregnancy.    Past Medical History:  Diagnosis Date   ADHD    ADD   Anxiety disorder    Chronic migraine without aura, intractable, without status migrainosus    Depression    Dichorionic diamniotic twin pregnancy in third trimester 05/19/2020   E coli bacteremia    Elevated blood-pressure reading without diagnosis of hypertension 05/28/2021   Former cigarette smoker    Psychiatric disorder    Pyelonephritis     Patient Active Problem List   Diagnosis Date Noted   IUD threads lost 02/16/2023   IUD (intrauterine device) in place 04/29/2021   History of cesarean delivery 05/21/2020   BMI 40.0-44.9, adult (HCC) 05/20/2020   Major depressive disorder, single episode, severe without psychotic features (HCC) 02/28/2014    Past Surgical History:  Procedure Laterality Date   CESAREAN SECTION  05/21/2020   Procedure: CESAREAN SECTION;  Surgeon: Warden Fillers, MD;  Location: [facility] LD [facility];  Service: Obstetrics;;   NONE      OB History     Gravida  2   Para  1   Term      Preterm  1   AB      Living  2      SAB      IAB      Ectopic       Multiple  1   Live Births  2            Home Medications    Prior to Admission medications   Medication Sig Start Date End Date Taking? Authorizing Provider  ibuprofen (ADVIL) 800 MG tablet Take 1 tablet (800 mg total) by mouth 3 (three) times daily. Start 03/17/2023 03/17/23  Yes Nixxon Faria, Noberto Retort, PA-C  amLODipine (NORVASC) 10 MG tablet Take by mouth. 09/10/22 03/09/23  [provider]  atenolol (TENORMIN) 50 MG tablet Take 100 mg by mouth daily. 07/18/22   [provider]  buPROPion (WELLBUTRIN XL) 150 MG 24 hr tablet Take 150 mg by mouth daily.    [provider]  Erenumab-aooe 70 MG/ML SOAJ Inject into the skin. 07/13/22 07/13/23  [provider]  FLUoxetine (PROZAC) 20 MG capsule Take 20 mg by mouth daily. 12/03/20   [provider]  gabapentin (NEURONTIN) 300 MG capsule Take 300 mg by mouth at bedtime. 12/30/21 06/28/22  [provider]  gabapentin (NEURONTIN) 300 MG capsule Take by mouth. 07/22/22   [provider]  guanFACINE (INTUNIV) 1 MG TB24 ER tablet Take 1 mg by mouth daily. 12/24/21   [provider]  lamoTRIgine (LAMICTAL) 150 MG tablet Take 150 mg by mouth daily. 03/24/22   [provider]  SUMAtriptan (IMITREX) 50 MG tablet Take 50 mg by mouth every 2 (two) hours as needed. 03/08/22   [provider]  traZODone (DESYREL) 150 MG tablet Take 150 mg by mouth at bedtime.    [provider]  VYVANSE 70 MG capsule Take 70 mg by mouth daily. 12/08/21   [provider]    Family History Family History  Problem Relation Age of Onset   Mental illness Neg Hx     Social History Social History   Tobacco Use   Smoking status: Former   Smokeless tobacco: Never  Advertising account planner   Vaping status: Every Day  Substance Use Topics   Alcohol use: No   Drug use: No     Allergies   Metoprolol and Sulfa antibiotics   Review of Systems Review of Systems  Constitutional:  Positive  for activity change. Negative for appetite change, fatigue and fever.  Musculoskeletal:  Positive for arthralgias, gait problem and joint swelling. Negative for myalgias.  Skin:  Negative for color change and wound.  Neurological:  Positive for numbness. Negative for weakness.     Physical Exam Triage Vital Signs ED Triage Vitals  Encounter Vitals Group     BP 03/16/23 1128 121/83     Systolic BP Percentile --      Diastolic BP Percentile --      Pulse Rate 03/16/23 1128 73     Resp 03/16/23 1128 20     Temp 03/16/23 1128 98.1 F (36.7 C)     Temp Source 03/16/23 1128 Oral     SpO2 03/16/23 1128 98 %     Weight --      Height --      Head Circumference --      Peak Flow --      Pain Score 03/16/23 1129 9     Pain Loc --      Pain Education --      Exclude from Growth Chart --    No data found.  Updated Vital Signs BP 121/83 (BP Location: Right Arm)   Pulse 73   Temp 98.1 F (36.7 C) (Oral)   Resp 20   SpO2 98%   Visual Acuity Right Eye Distance:   Left Eye Distance:   Bilateral Distance:    Right Eye Near:   Left Eye Near:    Bilateral Near:     Physical Exam Vitals reviewed.  Constitutional:      General: She is awake. She is not in acute distress.    Appearance: Normal appearance. She is well-developed. She is not ill-appearing.     Comments: Very pleasant female appears stated age in no acute distress sitting comfortably in exam room with foot propped up on table.  HENT:     Head: Normocephalic and atraumatic.  Cardiovascular:     Rate and Rhythm: Normal rate and regular rhythm.     Pulses:          Posterior tibial pulses are 2+ on the right side.     Heart sounds: Normal heart sounds, S1 normal and S2 normal. No murmur heard.    Comments: Capillary refill within 2 seconds right toes Pulmonary:     Effort: Pulmonary effort is normal.     Breath sounds: Normal breath sounds. No wheezing, rhonchi or rales.     Comments: Clear to auscultation  bilaterally Musculoskeletal:  Right lower leg: No edema.     Left lower leg: No edema.     Right ankle: Swelling present. Tenderness present over the lateral malleolus. Decreased range of motion.     Right foot: Decreased range of motion. Normal capillary refill. Tenderness present. No swelling or bony tenderness.     Comments: Right ankle/foot: Tenderness palpation at lateral malleolus and along fifth metatarsal.  No deformity noted.  Foot neurovascularly intact.  Patient unable to bear weight due to pain.  Psychiatric:        Behavior: Behavior is cooperative.      UC Treatments / Results  Labs (all labs ordered are listed, but only abnormal results are displayed) Labs Reviewed - No data to display  EKG   Radiology DG Foot Complete Right Result Date: 03/16/2023 CLINICAL DATA:  Pain and swelling post fall EXAM: RIGHT FOOT COMPLETE - 3+ VIEW COMPARISON:  06/02/2006 FINDINGS: There is no evidence of fracture or dislocation. There is no evidence of arthropathy or other focal bone abnormality. Soft tissues are unremarkable. IMPRESSION: Negative. Electronically Signed   By: Corlis Leak M.D.   On: 03/16/2023 12:59   DG Ankle Complete Right Result Date: 03/16/2023 CLINICAL DATA:  Pain and swelling post fall EXAM: RIGHT ANKLE - COMPLETE 3+ VIEW COMPARISON:  06/02/2006 FINDINGS: There is no evidence of fracture, dislocation, or joint effusion. Small calcaneal spurs. There is no evidence of arthropathy or other focal bone abnormality. Interval growth. Soft tissues are unremarkable. IMPRESSION: Negative. Electronically Signed   By: Corlis Leak M.D.   On: 03/16/2023 12:58    Procedures Procedures (including critical care time)  Medications Ordered in UC Medications  ketorolac (TORADOL) 30 MG/ML injection 30 mg (30 mg Intramuscular Given 03/16/23 1244)    Initial Impression / Assessment and Plan / UC Course  I have reviewed the triage vital signs and the nursing notes.  Pertinent labs &  imaging results that were available during my care of the patient were reviewed by me and considered in my medical decision making (see chart for details).  Clinical Course as of 03/16/23 1316  Tue Mar 16, 2023  1307 DG Foot Complete Right [ER]    Clinical Course User Index [ER] Jeani Hawking, PA-C    Patient is well-appearing, afebrile, nontoxic, nontachycardic.  Foot is neurovascularly intact.  X-ray of ankle and foot were obtained that showed no acute osseous abnormality.  Suspect severe strain as etiology of symptoms.  She was placed in cam boot for comfort and support.  Recommended RICE protocol.  She was given crutches to help with ambulation given she is having difficulty bearing weight but discussed that she can bear weight as tolerated.  She was given Toradol in clinic with some improvement of pain.  Discussed that she is not to take NSAIDs for 24 hours but can start ibuprofen tomorrow (03/17/2023).  She can use Tylenol/acetaminophen as needed for additional pain relief.  She is established and scheduled to see podiatry on 03/19/2023 and was strongly encouraged to keep this appointment.  We discussed that if anything worsens or changes she should be seen immediately.  Strict return precautions given.  Work excuse note provided.  Final Clinical Impressions(s) / UC Diagnoses   Final diagnoses:  Fall, initial encounter  Right foot pain  Acute right ankle pain     Discharge Instructions      They did not see anything broken on your x-rays.  I suspect you have severely sprained your ankle.  Use the cam boot for comfort and support.  Use crutches to help walk around until the pain improves.  You can put weight on the leg as tolerated.  We gave you an injection of Toradol today so please do not take NSAIDs including aspirin, ibuprofen/Advil, naproxen/Aleve for the next 24 hours.  I have called in ibuprofen 800 mg to help with your pain but you should not begin this until tomorrow  (03/17/2023).  You can use Tylenol/acetaminophen as needed in the meantime.  Keep the leg elevated and use ice for 15 minutes at a time 3-4 times per day.  Follow-up with podiatry as scheduled later this week.  If anything worsens or changes please return for reevaluation.    ED Prescriptions     Medication Sig Dispense Auth. Provider   ibuprofen (ADVIL) 800 MG tablet Take 1 tablet (800 mg total) by mouth 3 (three) times daily. Start 03/17/2023 21 tablet Lelan Cush, Noberto Retort, PA-C      PDMP not reviewed this encounter.   Jeani Hawking, PA-C 03/16/23 1316

## 2023-03-16 NOTE — Discharge Instructions (Signed)
 They did not see anything broken on your x-rays.  I suspect you have severely sprained your ankle.  Use the cam boot for comfort and support.  Use crutches to help walk around until the pain improves.  You can put weight on the leg as tolerated.  We gave you an injection of Toradol today so please do not take NSAIDs including aspirin, ibuprofen/Advil, naproxen/Aleve for the next 24 hours.  I have called in ibuprofen 800 mg to help with your pain but you should not begin this until tomorrow (03/17/2023).  You can use Tylenol/acetaminophen as needed in the meantime.  Keep the leg elevated and use ice for 15 minutes at a time 3-4 times per day.  Follow-up with podiatry as scheduled later this week.  If anything worsens or changes please return for reevaluation.

## 2023-03-16 NOTE — ED Triage Notes (Signed)
 Slipped on a step approx 0900 today.  Felt ankle pop and now having pain to right ankle. +swelling. +pedal pulse. Patient unable to bear weight.

## 2023-04-26 ENCOUNTER — Emergency Department (HOSPITAL_BASED_OUTPATIENT_CLINIC_OR_DEPARTMENT_OTHER)

## 2023-04-26 ENCOUNTER — Encounter (HOSPITAL_BASED_OUTPATIENT_CLINIC_OR_DEPARTMENT_OTHER): Payer: Self-pay | Admitting: Emergency Medicine

## 2023-04-26 ENCOUNTER — Emergency Department (HOSPITAL_BASED_OUTPATIENT_CLINIC_OR_DEPARTMENT_OTHER)
Admission: EM | Admit: 2023-04-26 | Discharge: 2023-04-26 | Disposition: A | Discharge status: Home / Self Care | Attending: Emergency Medicine | Admitting: Emergency Medicine

## 2023-04-26 ENCOUNTER — Other Ambulatory Visit: Payer: Self-pay

## 2023-04-26 DIAGNOSIS — R110 Nausea: Secondary | ICD-10-CM | POA: Diagnosis not present

## 2023-04-26 DIAGNOSIS — G8929 Other chronic pain: Secondary | ICD-10-CM

## 2023-04-26 DIAGNOSIS — M7731 Calcaneal spur, right foot: Secondary | ICD-10-CM | POA: Diagnosis not present

## 2023-04-26 DIAGNOSIS — M25571 Pain in right ankle and joints of right foot: Secondary | ICD-10-CM | POA: Diagnosis present

## 2023-04-26 LAB — CBC WITH DIFFERENTIAL/PLATELET
Abs Immature Granulocytes: 0.02 10*3/uL (ref 0.00–0.07)
Basophils Absolute: 0 10*3/uL (ref 0.0–0.1)
Basophils Relative: 0 %
Eosinophils Absolute: 0.2 10*3/uL (ref 0.0–0.5)
Eosinophils Relative: 3 %
HCT: 39.2 % (ref 36.0–46.0)
Hemoglobin: 12.9 g/dL (ref 12.0–15.0)
Immature Granulocytes: 0 %
Lymphocytes Relative: 13 %
Lymphs Abs: 0.9 10*3/uL (ref 0.7–4.0)
MCH: 29.3 pg (ref 26.0–34.0)
MCHC: 32.9 g/dL (ref 30.0–36.0)
MCV: 88.9 fL (ref 80.0–100.0)
Monocytes Absolute: 0.5 10*3/uL (ref 0.1–1.0)
Monocytes Relative: 7 %
Neutro Abs: 5.4 10*3/uL (ref 1.7–7.7)
Neutrophils Relative %: 77 %
Platelets: 222 10*3/uL (ref 150–400)
RBC: 4.41 MIL/uL (ref 3.87–5.11)
RDW: 13.8 % (ref 11.5–15.5)
WBC: 7.1 10*3/uL (ref 4.0–10.5)
nRBC: 0 % (ref 0.0–0.2)

## 2023-04-26 LAB — LIPASE, BLOOD: Lipase: 26 U/L (ref 11–51)

## 2023-04-26 LAB — URINALYSIS, W/ REFLEX TO CULTURE (INFECTION SUSPECTED)
Bilirubin Urine: NEGATIVE
Glucose, UA: NEGATIVE mg/dL
Ketones, ur: NEGATIVE mg/dL
Leukocytes,Ua: NEGATIVE
Nitrite: NEGATIVE
Protein, ur: NEGATIVE mg/dL
Specific Gravity, Urine: 1.02 (ref 1.005–1.030)
WBC, UA: NONE SEEN WBC/hpf (ref 0–5)
pH: 7.5 (ref 5.0–8.0)

## 2023-04-26 LAB — COMPREHENSIVE METABOLIC PANEL WITH GFR
ALT: 23 U/L (ref 0–44)
AST: 23 U/L (ref 15–41)
Albumin: 3.9 g/dL (ref 3.5–5.0)
Alkaline Phosphatase: 38 U/L (ref 38–126)
Anion gap: 8 (ref 5–15)
BUN: 10 mg/dL (ref 6–20)
CO2: 22 mmol/L (ref 22–32)
Calcium: 8.6 mg/dL — ABNORMAL LOW (ref 8.9–10.3)
Chloride: 105 mmol/L (ref 98–111)
Creatinine, Ser: 0.59 mg/dL (ref 0.44–1.00)
GFR, Estimated: >60 mL/min (ref >60)
Glucose, Bld: 125 mg/dL — ABNORMAL HIGH (ref 70–99)
Potassium: 3.8 mmol/L (ref 3.5–5.1)
Sodium: 135 mmol/L (ref 135–145)
Total Bilirubin: 0.9 mg/dL (ref 0.0–1.2)
Total Protein: 7 g/dL (ref 6.5–8.1)

## 2023-04-26 LAB — PREGNANCY, URINE: Preg Test, Ur: NEGATIVE

## 2023-04-26 MED ORDER — LIDOCAINE 5 % EX PTCH
1.0000 | MEDICATED_PATCH | CUTANEOUS | Status: DC
  Administered 2023-04-26: 1 via TRANSDERMAL
  Filled 2023-04-26: qty 1

## 2023-04-26 MED ORDER — ONDANSETRON 4 MG PO TBDP: 4.0000 mg | ORAL_TABLET | Freq: Three times a day (TID) | ORAL | 0 refills | Status: AC | PRN

## 2023-04-26 MED ORDER — ONDANSETRON 4 MG PO TBDP
4.0000 mg | ORAL_TABLET | Freq: Once | ORAL | Status: AC
  Administered 2023-04-26: 4 mg via ORAL
  Filled 2023-04-26: qty 1

## 2023-04-26 MED ORDER — MELOXICAM 7.5 MG PO TABS: 7.5000 mg | ORAL_TABLET | Freq: Every day | ORAL | 0 refills | Status: AC

## 2023-04-26 MED ORDER — KETOROLAC TROMETHAMINE 30 MG/ML IJ SOLN
30.0000 mg | Freq: Once | INTRAMUSCULAR | Status: AC
  Administered 2023-04-26: 30 mg via INTRAMUSCULAR
  Filled 2023-04-26: qty 1

## 2023-04-26 MED ORDER — LIDOCAINE 5 % EX PTCH: 1.0000 | MEDICATED_PATCH | CUTANEOUS | 0 refills | Status: AC

## 2023-04-26 NOTE — ED Triage Notes (Signed)
 Right ankle throbbing pain shooting to leg , sprained 2 months ago .  Also reports persistent  nausea , had stomach bug 2 weeks ago ,

## 2023-04-26 NOTE — ED Notes (Signed)
 D/c paperwork reviewed with pt, including prescriptions and follow up care.  All questions and/or concerns addressed at time of d/c.  No further needs expressed. . Pt verbalized understanding, Ambulatory with significant other to ED exit, NAD.

## 2023-04-26 NOTE — ED Provider Notes (Signed)
 [place] [facility] Provider Note   CSN: [id number] Arrival date & time: 04/26/23  1348    History  Chief Complaint  Patient presents with   Ankle Pain    rt    Cathy Roman is a 28 y.o. female here for right ankle pain. Had a "severe" sprain to right ankle at end of Feb. Seen by [facility]. Had been in a walking boot. Came out of the walking boot about end of March.  Has noted pain that goes from ankle to mid leg.  Also noted intermittent swelling to the right foot and ankle.  No redness, warmth.  No history of PE or DVT.  No chest pain or shortness of breath.  No numbness or weakness.  No recent falls or injuries.  She also notes she has had some nausea.  States she recently got over the "stomach bug."  No fever, abdominal pain, dysuria, chance of pregnancy, pelvic pain.  She does note that she drinks alcohol.  She states she has had chronic back pain since having her children which is unchanged.  HPI     Home Medications Prior to Admission medications   Medication Sig Start Date End Date Taking? Authorizing Provider  lidocaine (LIDODERM) 5 % Place 1 patch onto the skin daily. Remove & Discard patch within 12 hours or as directed by MD 04/26/23  Yes Jazzalynn Rhudy A, PA-C  meloxicam (MOBIC) 7.5 MG tablet Take 1 tablet (7.5 mg total) by mouth daily. 04/26/23  Yes Gurkirat Basher A, PA-C  ondansetron (ZOFRAN-ODT) 4 MG disintegrating tablet Take 1 tablet (4 mg total) by mouth every 8 (eight) hours as needed. 04/26/23  Yes Nithya Meriweather A, PA-C  amLODipine (NORVASC) 10 MG tablet Take by mouth. 09/10/22 03/09/23  [provider]  atenolol (TENORMIN) 50 MG tablet Take 100 mg by mouth daily. 07/18/22   [provider]  buPROPion (WELLBUTRIN XL) 150 MG 24 hr tablet Take 150 mg by mouth daily.    [provider]  Erenumab-aooe 70 MG/ML SOAJ Inject into the skin. 07/13/22 07/13/23  [provider]  FLUoxetine (PROZAC) 20 MG  capsule Take 20 mg by mouth daily. 12/03/20   [provider]  gabapentin (NEURONTIN) 300 MG capsule Take 300 mg by mouth at bedtime. 12/30/21 06/28/22  [provider]  gabapentin (NEURONTIN) 300 MG capsule Take by mouth. 07/22/22   [provider]  guanFACINE (INTUNIV) 1 MG TB24 ER tablet Take 1 mg by mouth daily. 12/24/21   [provider]  lamoTRIgine (LAMICTAL) 150 MG tablet Take 150 mg by mouth daily. 03/24/22   [provider]  SUMAtriptan (IMITREX) 50 MG tablet Take 50 mg by mouth every 2 (two) hours as needed. 03/08/22   [provider]  traZODone (DESYREL) 150 MG tablet Take 150 mg by mouth at bedtime.    [provider]  VYVANSE 70 MG capsule Take 70 mg by mouth daily. 12/08/21   [provider]      Allergies    Metoprolol and Sulfa antibiotics    Review of Systems   Review of Systems  Constitutional: Negative.   HENT: Negative.    Respiratory: Negative.    Cardiovascular: Negative.   Gastrointestinal:  Positive for nausea. Negative for abdominal distention, abdominal pain, anal bleeding, blood in stool, constipation, diarrhea, rectal pain and vomiting.  Genitourinary: Negative.   Musculoskeletal:        Right ankle pain, mid leg pain, intermittent  swelling  Skin: Negative.   Neurological: Negative.   All other systems reviewed and are negative.   Physical Exam Updated Vital Signs BP 117/82 (BP Location: Left Arm)   Pulse 68   Temp 98 F (36.7 C) (Oral)   Resp 15   Wt 90.7 kg   SpO2 99%   BMI 34.33 kg/m  Physical Exam Vitals and nursing note reviewed.  Constitutional:      General: She is not in acute distress.    Appearance: She is well-developed. She is not ill-appearing, toxic-appearing or diaphoretic.  HENT:     Head: Atraumatic.     Nose: Nose normal.     Mouth/Throat:     Mouth: Mucous membranes are moist.  Eyes:     Pupils: Pupils are equal, round, and reactive to light.   Cardiovascular:     Rate and Rhythm: Normal rate.     Pulses: Normal pulses.          Dorsalis pedis pulses are 2+ on the right side.       Posterior tibial pulses are 2+ on the right side.  Pulmonary:     Effort: No respiratory distress.  Abdominal:     General: There is no distension.  Musculoskeletal:        General: Normal range of motion.     Cervical back: Normal range of motion.     Comments: No midline C/T/L tenderness.  Diffuse tenderness right lower extremity, no erythema or warmth.  No obvious swelling.  Compartments soft.  Wiggles toes.  Full range of motion of major joints without difficulty  Skin:    General: Skin is warm and dry.     Capillary Refill: Capillary refill takes less than 2 seconds.     Comments: No edema, erythema or warmth.  Neurological:     General: No focal deficit present.     Mental Status: She is alert.     Sensory: Sensation is intact.     Motor: Motor function is intact.     Gait: Gait is intact. Gait normal.     Comments: Tach sensation, ambulatory  Psychiatric:        Mood and Affect: Mood normal.    ED Results / Procedures / Treatments   Labs (all labs ordered are listed, but only abnormal results are displayed) Labs Reviewed  URINALYSIS, W/ REFLEX TO CULTURE (INFECTION SUSPECTED) - Abnormal; Notable for the following components:      Result Value   Hgb urine dipstick TRACE (*)    Bacteria, UA RARE (*)    All other components within normal limits  COMPREHENSIVE METABOLIC PANEL WITH GFR - Abnormal; Notable for the following components:   Glucose, Bld 125 (*)    Calcium 8.6 (*)    All other components within normal limits  PREGNANCY, URINE  CBC WITH DIFFERENTIAL/PLATELET  LIPASE, BLOOD    EKG None  Radiology US  Venous Img Lower Unilateral Right Result Date: 04/26/2023 CLINICAL DATA:  Pain and swelling EXAM: Right LOWER EXTREMITY VENOUS DOPPLER ULTRASOUND TECHNIQUE: Gray-scale sonography with compression, as well as color and  duplex ultrasound, were performed to evaluate the deep venous system(s) from the level of the common femoral vein through the popliteal and proximal calf veins. COMPARISON:  None Available. FINDINGS: VENOUS Normal compressibility of the common femoral, superficial femoral, and popliteal veins, as well as the visualized calf veins. Visualized portions of profunda femoral vein and great saphenous vein unremarkable. No filling defects to suggest DVT on  grayscale or color Doppler imaging. Doppler waveforms show normal direction of venous flow, normal respiratory plasticity and response to augmentation. Limited views of the contralateral common femoral vein are unremarkable. OTHER None. Limitations: none IMPRESSION: No evidence of right lower extremity DVT. Electronically Signed   By: Adrianna Horde M.D.   On: 04/26/2023 15:10   DG Ankle Complete Right Result Date: 04/26/2023 CLINICAL DATA:  Pain and swelling. EXAM: RIGHT ANKLE - COMPLETE 3 VIEW COMPARISON:  03/16/2023. FINDINGS: There is no evidence of fracture, dislocation, or joint effusion. There is no evidence of arthropathy or other focal bone abnormality. Soft tissues are unremarkable. Small plantar and posterior calcaneal spurs. IMPRESSION: Calcaneal spurs. No acute osseous abnormalities. Electronically Signed   By: Sydell Eva M.D.   On: 04/26/2023 14:43    Procedures Procedures    Medications Ordered in ED Medications  lidocaine (LIDODERM) 5 % 1 patch (has no administration in time range)  ondansetron (ZOFRAN-ODT) disintegrating tablet 4 mg (4 mg Oral Given 04/26/23 1416)  ketorolac (TORADOL) 30 MG/ML injection 30 mg (30 mg Intramuscular Given 04/26/23 1509)    ED Course/ Medical Decision Making/ A&P   Here for right leg pain.  Had ankle injury the end of February.  Has continued to have pain.  Was taken out of her walking boot by orthopedics about a week ago when she started having increased pain and swelling.  Goes up into her leg.  No  numbness or weakness.  She has no obvious infectious process on exam.  No obvious joint effusion low suspicion for septic joint, cellulitis, necrotizing infection.  She does have some diffuse pain to her leg will add on ultrasound given she has had decreased range of motion.  She is neurovascularly intact.  She does have some chronic back pain, consider radiculopathy however no red flag symptoms.  She states her pain is chronic to this area no recent injury.  Have low suspicion for sciatica causing her symptoms.  Even so I would treat with anti-inflammatories as I am doing for her ankle.  Will plan to get repeat x-ray.  Has had some nausea states she recently got over the stomach bug.  No abdominal pain.  No emesis.  She would like me to check her labs.  Labs and imaging personally viewed and interpreted:  UA negative for infection Pregnancy test negative CBC without leukocytosis Metabolic panel glucose 125 Lipase 26 X-ray negative for occult fracture, dislocation, effusion does show heel spur Ultrasound negative for DVT  Discussed results with patient.  She was given a cam boot.  I have written her contact information for an additional orthopedist that she would like second opinion.  She is tolerating p.o. intake here.  She does note that she drinks some alcohol.  No bloody emesis or bloody stool.  Discussed bland diet the next few days.  Low suspicion for infectious process, obstruction, perforation, GI bleed, pregnancy, cholelithiasis, cholecystitis.  The patient has been appropriately medically screened and/or stabilized in the ED. I have low suspicion for any other emergent medical condition which would require further screening, evaluation or treatment in the ED or require inpatient management.  Patient is hemodynamically stable and in no acute distress.  Patient able to ambulate in department prior to ED.  Evaluation does not show acute pathology that would require ongoing or additional  emergent interventions while in the emergency department or further inpatient treatment.  I have discussed the diagnosis with the patient and answered all questions.  Pain is  been managed while in the emergency department and patient has no further complaints prior to discharge.  Patient is comfortable with plan discussed in room and is stable for discharge at this time.  I have discussed strict return precautions for returning to the emergency department.  Patient was encouraged to follow-up with PCP/specialist refer to at discharge.                                   Medical Decision Making Amount and/or Complexity of Data Reviewed External Data Reviewed: labs, radiology and notes. Labs: ordered. Decision-making details documented in ED Course. Radiology: ordered and independent interpretation performed. Decision-making details documented in ED Course.  Risk OTC drugs. Prescription drug management. Decision regarding hospitalization. Diagnosis or treatment significantly limited by social determinants of health.          Final Clinical Impression(s) / ED Diagnoses Final diagnoses:  Chronic pain of right ankle  Nausea  Calcaneal spur of right foot    Rx / DC Orders ED Discharge Orders          Ordered    meloxicam (MOBIC) 7.5 MG tablet  Daily        04/26/23 1606    lidocaine (LIDODERM) 5 %  Every 24 hours        04/26/23 1614    ondansetron (ZOFRAN-ODT) 4 MG disintegrating tablet  Every 8 hours PRN        04/26/23 1618              Savanah Bayles A, PA-C 04/26/23 1619    Long, Joshua G, MD 05/01/23 0206

## 2023-04-26 NOTE — Discharge Instructions (Addendum)
 It was a pleasure taking care of you here in the emergency department.  Your lab work today was reassuring Your x-ray of your ankle did show a heel spur.  The ultrasound was negative for blood clot.  I written you for a medication called Mobic which is an anti-inflammatory.  Make sure to eat this with a meal.  If you develop upper abdominal pain, bloody stool please stop taking this medication.  Do not take any other additional anti-inflammatories while taking this medicine  I have written for some lidocaine pain patches.  Place 1 patch to the area where you are having pain.  Leave on for 12 hours and then take off for 12 hours.  I referred you to an orthopedist since you did not want to go back to your original 1.  Make sure to call to schedule an appointment  Return for new or worsening symptoms

## 2023-06-07 ENCOUNTER — Encounter: Payer: Self-pay | Admitting: Family Medicine

## 2023-06-17 ENCOUNTER — Encounter: Payer: Self-pay | Admitting: Obstetrics and Gynecology

## 2023-06-17 ENCOUNTER — Other Ambulatory Visit: Payer: Self-pay

## 2023-06-17 ENCOUNTER — Other Ambulatory Visit (HOSPITAL_COMMUNITY)
Admission: RE | Admit: 2023-06-17 | Discharge: 2023-06-17 | Disposition: A | Discharge status: Home / Self Care | Source: Ambulatory Visit | Attending: Obstetrics and Gynecology | Admitting: Obstetrics and Gynecology

## 2023-06-17 ENCOUNTER — Ambulatory Visit: Admitting: Obstetrics and Gynecology

## 2023-06-17 VITALS — BP 94/60 | HR 85 | Wt 199.3 lb

## 2023-06-17 DIAGNOSIS — N898 Other specified noninflammatory disorders of vagina: Secondary | ICD-10-CM

## 2023-06-17 DIAGNOSIS — Z975 Presence of (intrauterine) contraceptive device: Secondary | ICD-10-CM

## 2023-06-17 DIAGNOSIS — R102 Pelvic and perineal pain: Secondary | ICD-10-CM | POA: Diagnosis not present

## 2023-06-17 LAB — POCT URINALYSIS DIP (DEVICE)
Bilirubin Urine: NEGATIVE
Glucose, UA: NEGATIVE mg/dL
Ketones, ur: NEGATIVE mg/dL
Leukocytes,Ua: NEGATIVE
Nitrite: NEGATIVE
Protein, ur: NEGATIVE mg/dL
Specific Gravity, Urine: 1.015 (ref 1.005–1.030)
Urobilinogen, UA: 0.2 mg/dL (ref 0.0–1.0)
pH: 8.5 — ABNORMAL HIGH (ref 5.0–8.0)

## 2023-06-17 NOTE — Progress Notes (Signed)
 Patient requested US  contact information so that she can call at her convenience.  Pt provided with number in patient instructions.   Juliauna Stueve,RN

## 2023-06-17 NOTE — Progress Notes (Unsigned)
   GYNECOLOGY PROGRESS NOTE  History:  28 y.o. G2P0102 presents to [facility] for problem gyn. Has an IUD in place. Reports she bled for a day on 06/07/23 and has been spotting and clotting for 2 days. Bleeding is minimal. Has had other IUDs and has not had any bleeding. She had an episode of sharp dull pain left lower abdomen. Has had neg UPT at home. She endorses she will occasionally have an odor. There was a time she could not feel the strings, she currently can feel them   The following portions of the patient's history were reviewed and updated as appropriate: allergies, current medications, past family history, past medical history, past social history, past surgical history and problem list. Last pap smear on 06/01/22 was normal  Health Maintenance Due  Topic Date Due   HIV Screening  Never done   Hepatitis C Screening  Never done   COVID-19 Vaccine (1 - 2024-25 season) Never done     Review of Systems:  Pertinent items are noted in HPI.   Objective:  Physical Exam There were no vitals taken for this visit. VS reviewed, nursing note reviewed,  Constitutional: well developed, well nourished, no distress HEENT: normocephalic CV: normal rate Pulm/chest wall: normal effort Breast Exam: deferred Abdomen: soft Neuro: alert and oriented x 3 Skin: warm, dry Psych: affect normal Pelvic exam: Cervix pink, visually closed, without lesion, scant white creamy discharge, vaginal walls and external genitalia normal. IUD strings visualized right at the os   Assessment & Plan:  1. IUD (intrauterine device) in place (Primary) Does not desire removal, will assess u/s, if continue bleeding discussed trial OCP for breakthrough bleeding  - US  PELVIC COMPLETE WITH TRANSVAGINAL; Future  2. Vaginal odor Assess urine and swab, follow up with results UPT neg - Cervicovaginal ancillary only - POCT Urinalysis Dipstick   3. Pelvic pain  - US  PELVIC COMPLETE WITH TRANSVAGINAL; Future     Susi Eric, FNP

## 2023-06-17 NOTE — Patient Instructions (Signed)
 Call [phone] to schedule ultrasound appointment at the [facility] location

## 2023-06-18 ENCOUNTER — Inpatient Hospital Stay (HOSPITAL_BASED_OUTPATIENT_CLINIC_OR_DEPARTMENT_OTHER): Admission: RE | Admit: 2023-06-18 | Source: Ambulatory Visit | Admitting: Radiology

## 2023-06-18 LAB — CERVICOVAGINAL ANCILLARY ONLY
Bacterial Vaginitis (gardnerella): NEGATIVE
Candida Glabrata: NEGATIVE
Candida Vaginitis: NEGATIVE
Chlamydia: NEGATIVE
Comment: NEGATIVE
Comment: NEGATIVE
Comment: NEGATIVE
Comment: NEGATIVE
Comment: NEGATIVE
Comment: NORMAL
Neisseria Gonorrhea: NEGATIVE
Trichomonas: NEGATIVE

## 2023-06-21 ENCOUNTER — Ambulatory Visit: Payer: Self-pay | Admitting: Obstetrics and Gynecology

## 2023-06-23 ENCOUNTER — Ambulatory Visit (HOSPITAL_BASED_OUTPATIENT_CLINIC_OR_DEPARTMENT_OTHER)
Admission: RE | Admit: 2023-06-23 | Discharge: 2023-06-23 | Disposition: A | Discharge status: Home / Self Care | Source: Ambulatory Visit | Attending: Obstetrics and Gynecology | Admitting: Obstetrics and Gynecology

## 2023-06-23 DIAGNOSIS — R102 Pelvic and perineal pain: Secondary | ICD-10-CM

## 2023-06-23 DIAGNOSIS — Z975 Presence of (intrauterine) contraceptive device: Secondary | ICD-10-CM | POA: Diagnosis not present

## 2023-06-24 LAB — POCT PREGNANCY, URINE: Preg Test, Ur: NEGATIVE

## 2023-08-31 NOTE — Telephone Encounter (Signed)
 Pt request you increase her Wegovy dose she is currently taking 0.5mg 

## 2023-09-06 NOTE — Progress Notes (Signed)
 Patient Name:  Cathy Roman Date Of Birth:  08-Jun-1995 Medical Record Number:  [id number] Date:  09/06/2023 Diagnosis: Class 1 obesity due to excess calories with serious comorbidity and body mass index (BMI) of 32.0 to 32.9 in adult [E66.811, E66.09, Z68.32] Primary Physician: Lolita Acre, NP  Cathy Roman is a 28 y.o. female.    CHIEF COMPLAINT  Weight Management (Discuss increasing ,Discuss side effects  is having Nausea and  Diarrhea every other day. )   ASSESSMENT AND PLAN    Assessment & Plan Class 1 obesity due to excess calories with serious comorbidity and body mass index (BMI) of 32.0 to 32.9 in adult Obesity class 1 managed with Tzhncb. Weight decreased from 201 to 190 pounds. Plateau in weight loss. Interested in increasing Wegovy dose. Current lifestyle: low-fat diet, walking 15-20 minutes 3-4 times a week. - Increase Wegovy to 1.7 mg weekly subcutaneously. - Encourage walking 15-20 minutes 4-5 times a week. - Advise to continue low-fat diet and make healthy food choices. - Discussed maximum Wegovy dose of 2.4 mg. - Instruct to call next month for dose increase without visit. - Schedule follow-up in 3 months. Orders: .  semaglutide (Wegovy) 1.7 mg/0.75 mL pen injector; Inject 1.7 mg under the skin every 7 days.  Nausea Nausea and diarrhea every other day due to [place]. Prefers dissolvable Zofran  for relief. - Prescribe Zofran  8 mg dissolvable tablets for nausea. Orders: .  ondansetron  (ZOFRAN  ODT) 8 mg disintegrating tablet; Take 1 tablet (8 mg total) by mouth every 8 (eight) hours as needed for nausea or vomiting.   Subjective HISTORY OF PRESENT ILLNESS   History of Present Illness A 28 year old female who presents for follow-up on obesity management.  She is currently on Wegovy 1 mg weekly subcutaneously, which has resulted in a weight reduction from 201 pounds to 190 pounds. However, she experiences nausea and diarrhea every other day as side effects of the  medication.  She follows a low-fat diet and attempts to walk 15 to 20 minutes three to four times a week. Her weight was at 186 pounds before her current weight of 190 pounds.   Past Medical History[1]  Surgical History[2]  Family History[3]  Social History[4]  Current Medications[5]  Allergies[6]  HISTORIES REVIEWED  The following sections of the medical record have been reviewed, and updated as appropriate, during this encounter:  Tobacco  Allergies  Meds  Problems  Med Hx  Surg Hx  Fam Hx       REVIEW OF SYSTEMS  Review of Systems  All other systems reviewed and are negative.   Objective   Vitals:   09/06/23 1005  BP: 116/78  Pulse: 80  Resp: 18  Temp: 98.7 F  TempSrc: Temporal  SpO2: 98%  Weight: 190 lb  Height: 5' 4   Body mass index is 32.61 kg/m.  PHYSICAL EXAMINATION   Physical Exam MEASUREMENTS: Weight- 190 lbs. Obese. GENERAL: Alert, cooperative, well developed, no acute distress HEENT: Normocephalic, normal oropharynx, moist mucous membranes CHEST: Clear to auscultation bilaterally, no wheezes, rhonchi, or crackles, lungs normal CARDIOVASCULAR: Normal heart rate and rhythm, S1 and S2 normal without murmurs ABDOMEN: Soft, non-tender, non-distended, without organomegaly, normal bowel sounds EXTREMITIES: No cyanosis or edema NEUROLOGICAL: Cranial nerves grossly intact, moves all extremities without gross motor or sensory deficit   RESULTS   Results      FOLLOW UP  Return in about 3 months (around 12/07/2023), or wegovy, for chronic 15 minutes.  Attestation   Lolita  Sofie, NP  Encounter time   To enhance the accuracy and efficiency of documentation, utilization of AI-powered ambient listening technology was employed.  This technology securely captures and transcribes provider-patient conversations in real time to assist in generating medical notes.  The recorded data is processed in compliance with HIPAA and other applicable privacy  and security regulations to ensure confidentiality and protection of health information.  AI technology does not make clinical decisions or replace your provider's medical judgment.  Patient consented to the use of this technology at the start of this visit.      [1] PMH- . Acute headache  . ADHD (attention deficit hyperactivity disorder)  . Depression  [2] Past Surgical History: Procedure Laterality Date  . CESAREAN SECTION, CLASSIC  05/21/2020  [3] Family History Problem Relation Age of Onset  . Hypertension Mother   . Diabetes Father   . Hyperlipidemia Father   . Hypertension Father   . COPD Maternal Grandmother   . Cancer Maternal Grandmother   . Heart attack Maternal Grandmother   . Alzheimer's disease Maternal Grandmother   . Dementia Maternal Grandmother   . Diabetes Maternal Grandfather   . Alzheimer's disease Maternal Grandfather   . Dementia Maternal Grandfather   . Alcohol abuse Maternal Grandfather   . Alzheimer's disease Paternal Grandmother   . Dementia Paternal Grandmother   . Cancer Paternal Grandmother        thyroid   . Cancer Paternal Grandfather        rectal  [4] Social History Socioeconomic History  . Marital status: Single  Tobacco Use  . Smoking status: Former    Current packs/day: 0.00    Average packs/day: 0.5 packs/day for 5.0 years (2.5 ttl pk-yrs)    Types: Cigarettes    Start date: 01/20/2011    Quit date: 01/20/2016    Years since quitting: 7.6  . Smokeless tobacco: Current  . Tobacco comments:    Vaping   Substance and Sexual Activity  . Alcohol use: Not Currently    Alcohol/week: 1.0 standard drink of alcohol    Types: 1 Glasses of wine per week    Comment: past hx of heavy drinking . Occ have a drink now  . Drug use: Never  . Sexual activity: Not Currently   Social Drivers of Health   Food Insecurity: No Food Insecurity (02/16/2023)   Received from [facility]   Hunger Vital Sign   . Worried About Programme Researcher, Broadcasting/film/video in the  Last Year: Never true   . Ran Out of Food in the Last Year: Never true  Transportation Needs: No Transportation Needs (02/16/2023)   Received from [facility] - Transportation   . Lack of Transportation (Medical): No   . Lack of Transportation (Non-Medical): No  [5]  Current Outpatient Medications:  .  amLODIPine (NORVASC) 10 mg tablet .  atenoloL (TENORMIN) 100 mg tablet .  buPROPion  (WELLBUTRIN  XL) 150 mg 24 hr tablet .  erenumab-aooe 70 mg/mL auto-injector .  FLUoxetine (PROzac) 20 mg capsule .  gabapentin (NEURONTIN) 300 mg capsule .  guanFACINE (INTUNIV ER) 1 mg tablet extended release 24 hr .  ibuprofen  (MOTRIN ) 800 mg tablet .  lamoTRIgine (LaMICtal) 150 mg tablet .  methylphenidate  HCl (RITALIN ) 20 mg tablet .  rOPINIRole (REQUIP) 0.5 mg tablet .  SUMAtriptan (IMITREX) 100 mg tablet .  traZODone  (DESYREL ) 150 mg tablet .  Vyvanse 70 mg capsule .  ondansetron  (ZOFRAN  ODT) 8 mg disintegrating tablet .  semaglutide [facility]) 1.7 mg/0.75 mL pen injector [6] Allergies Allergen Reactions  . Sulfasalazine Hives  . Losartan     Jittery and tremors  . Metoprolol Other (see comments)    constipation  . Sulfa (Sulfonamide Antibiotics) Hives  . Sulfacetamide

## 2023-09-12 IMAGING — US US OB COMP LESS 14 WK
1 series · 13 of 28 positions shown · non-contrast
Comparison: None.

CLINICAL DATA: Vaginal bleeding in pregnancy. LMP: 11/19/2020
corresponding to an estimated gestational age of 7 weeks 6 days.

EXAM:
OBSTETRIC <14 WK US AND TRANSVAGINAL OB US
TECHNIQUE: Both transabdominal and transvaginal ultrasound examinations were
performed for complete evaluation of the gestation as well as the
maternal uterus, adnexal regions, and pelvic cul-de-sac.
Transvaginal technique was performed to assess early pregnancy.

[Series 1: us ob comp less 14 wk · 98 acquisitions, 13 frames shown]
[im 4/98]
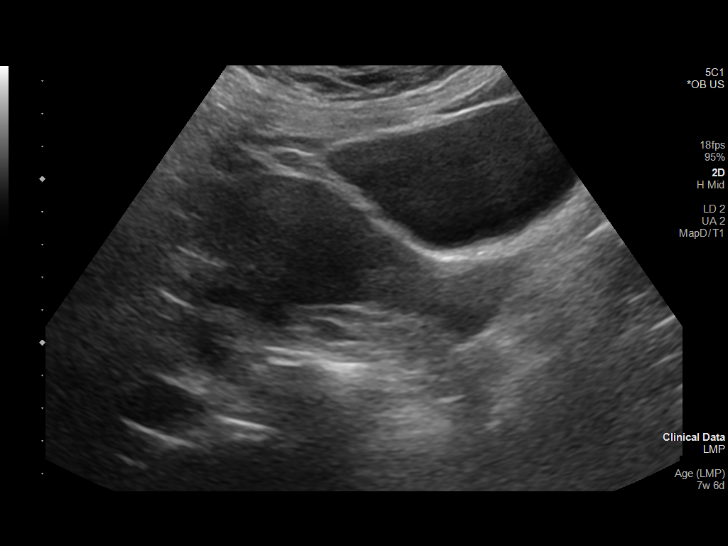
[im 11/98]
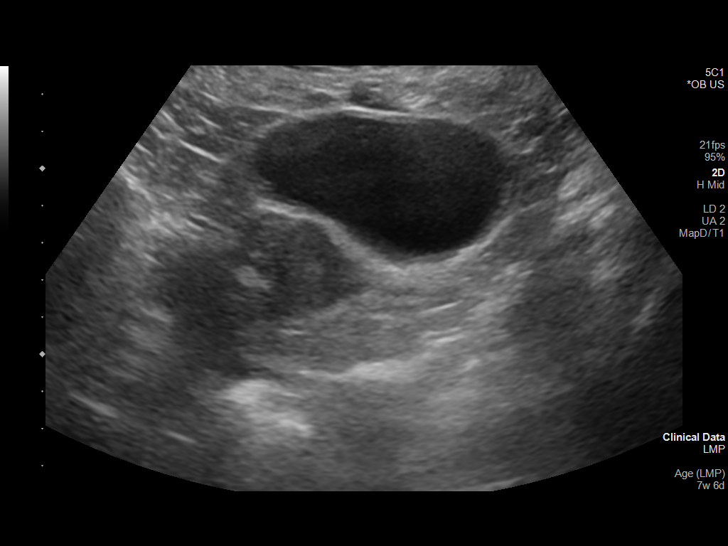
[im 18/98]
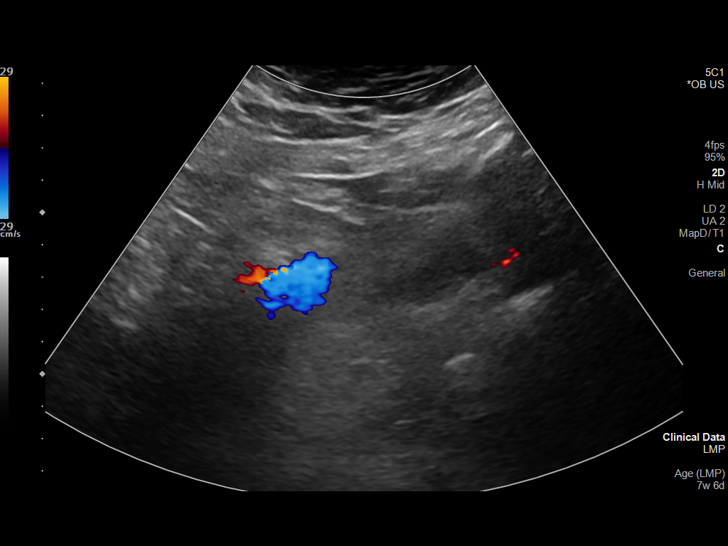
[im 26/98]
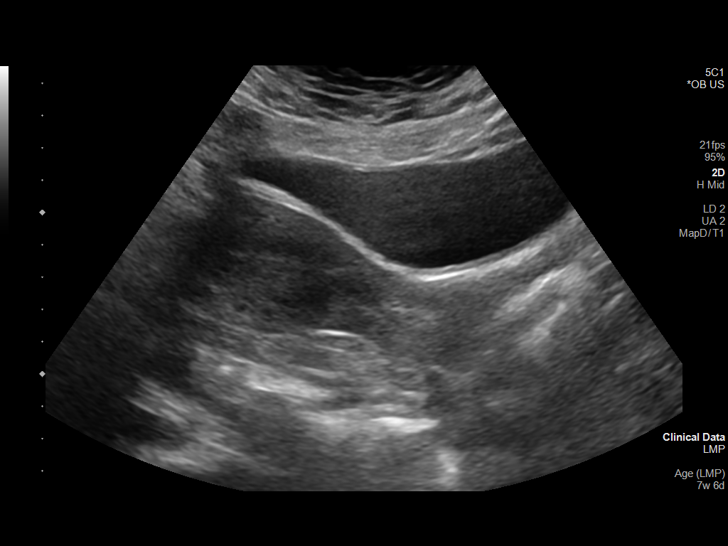
[im 33/98]
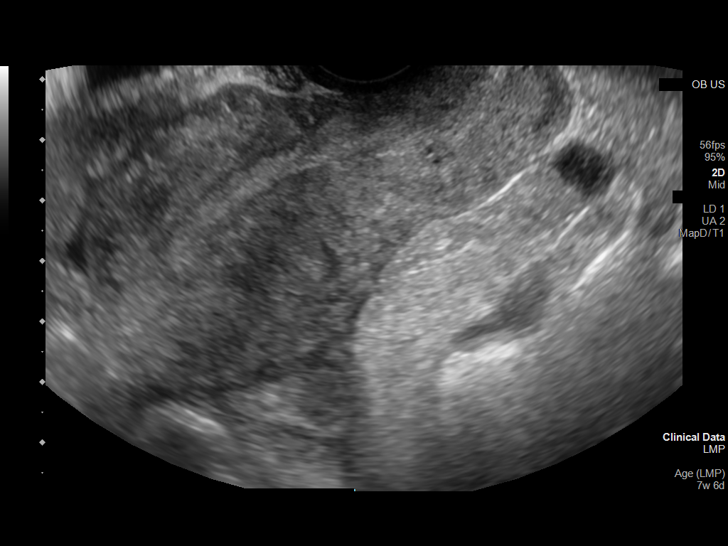
[im 40/98]
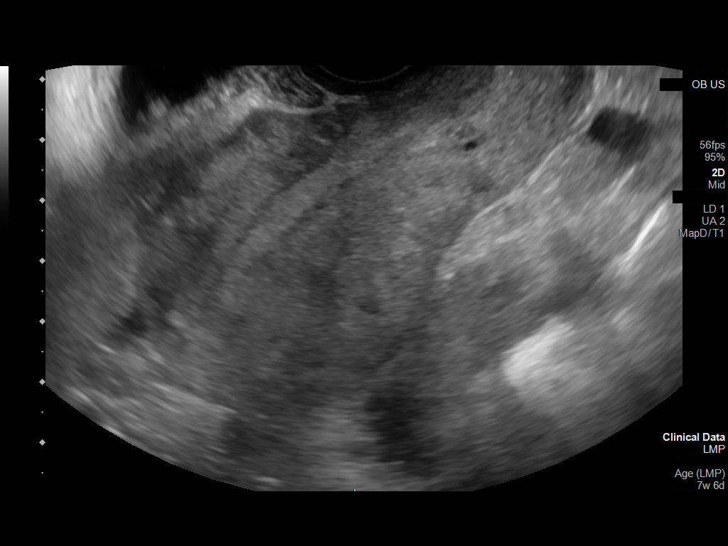
[im 51/98]
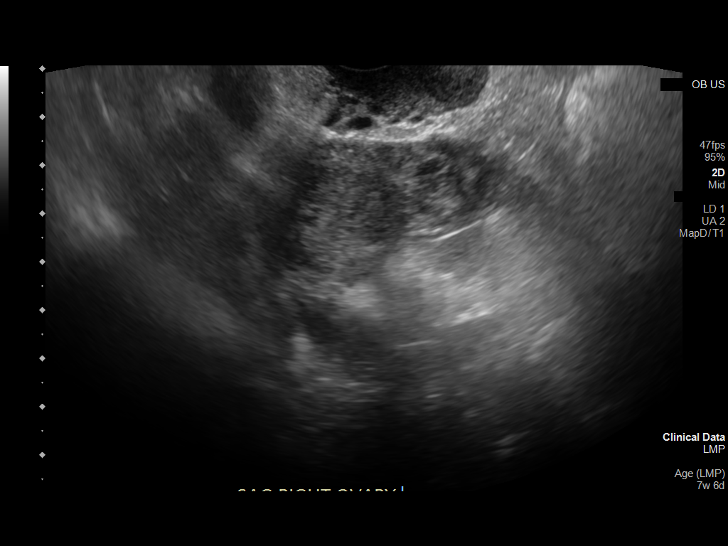
[im 58/98]
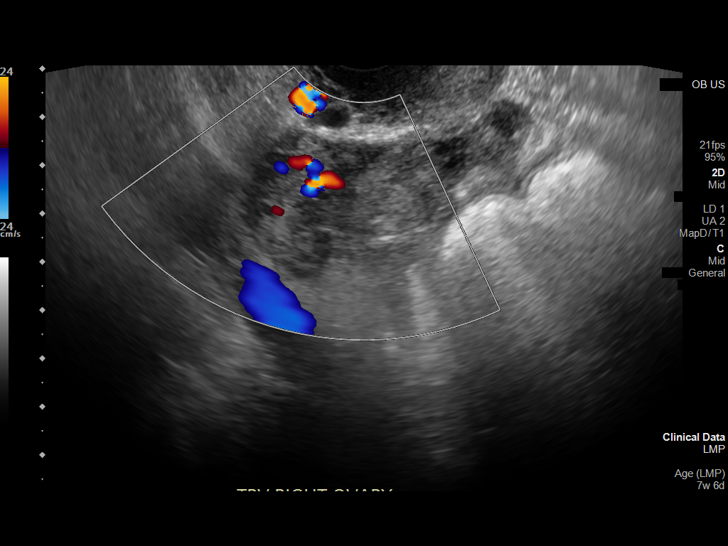
[im 65/98]
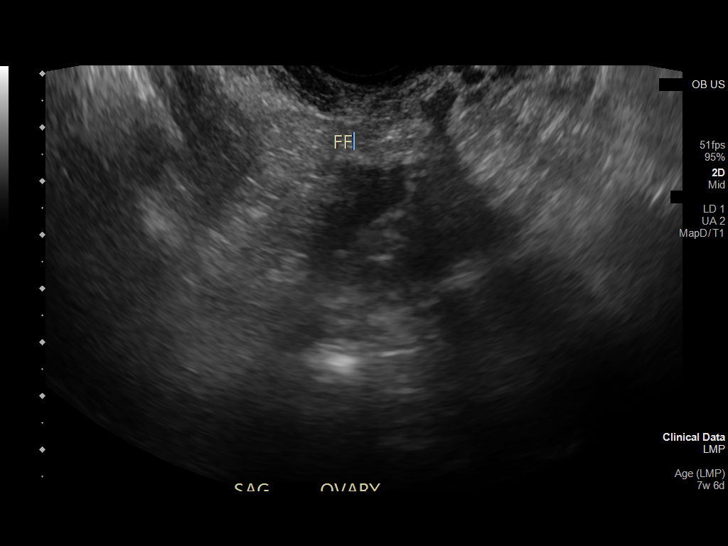
[im 72/98]
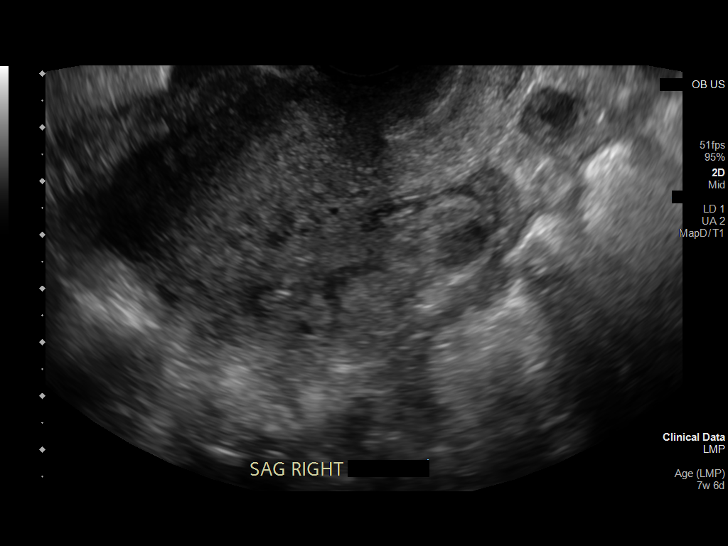
[im 80/98]
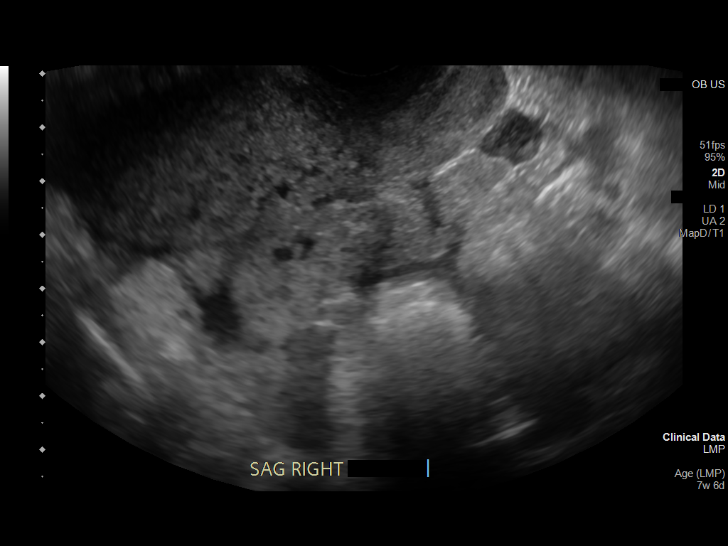
[im 87/98]
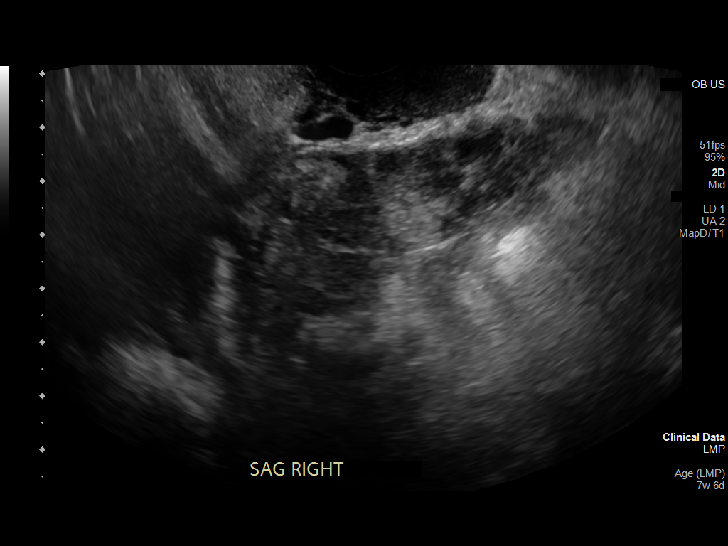
[im 94/98]
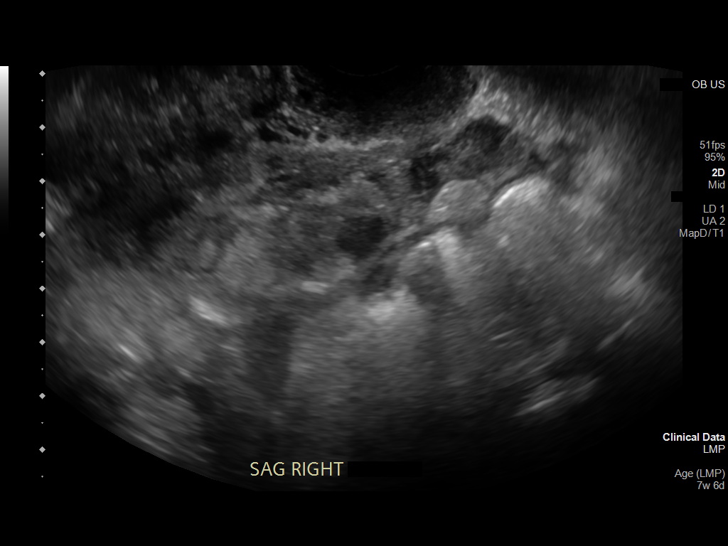

[13 of 28 positions shown; findings below may reference images not displayed]

FINDINGS: The uterus is anteverted. The uterus demonstrates a heterogeneous
echotexture with findings suggestive of adenomyosis.

The endometrium measures 6 mm in thickness. No intrauterine
pregnancy identified.

The left ovary appears unremarkable. The right ovary is suboptimally
visualized due to surrounding echogenic content, likely clot.

There is a complex heterogeneous and predominantly echogenic
collection around the right ovary which may represent blood
products/clot. Clinical correlation is recommended to evaluate for
possibility of a ruptured ectopic pregnancy.
IMPRESSION: 1. No intrauterine pregnancy identified.
2. Probable blood clot surrounding the right ovary. Clinical
correlation is recommended to evaluate for possibility of a ruptured
ectopic pregnancy.

These results were called by telephone at the time of interpretation
on 01/13/2021 at [DATE] to provider KLPIGBB MOOLMAN , who verbally
acknowledged these results.

## 2023-12-07 ENCOUNTER — Encounter: Payer: Self-pay | Admitting: Family Medicine
